# Patient Record
Sex: Male | Born: 1960 | Race: White | Hispanic: No | Marital: Married | Smoking: Never smoker
Health system: Southern US, Community
[De-identification: ages and names within clinical notes are randomized; demographics above are authoritative.]

## PROBLEM LIST (undated history)

## (undated) DIAGNOSIS — Z972 Presence of dental prosthetic device (complete) (partial): Secondary | ICD-10-CM

## (undated) DIAGNOSIS — Z87898 Personal history of other specified conditions: Secondary | ICD-10-CM

## (undated) DIAGNOSIS — Z86718 Personal history of other venous thrombosis and embolism: Secondary | ICD-10-CM

## (undated) DIAGNOSIS — Z973 Presence of spectacles and contact lenses: Secondary | ICD-10-CM

## (undated) DIAGNOSIS — C61 Malignant neoplasm of prostate: Secondary | ICD-10-CM

## (undated) DIAGNOSIS — Z86018 Personal history of other benign neoplasm: Secondary | ICD-10-CM

## (undated) DIAGNOSIS — I10 Essential (primary) hypertension: Secondary | ICD-10-CM

## (undated) DIAGNOSIS — Z8719 Personal history of other diseases of the digestive system: Secondary | ICD-10-CM

## (undated) HISTORY — PX: PARTIAL COLECTOMY: SHX5273

## (undated) HISTORY — PX: PROSTATE BIOPSY: SHX241

---

## 1993-04-20 HISTORY — PX: KNEE ARTHROSCOPY: SUR90

## 2010-08-26 ENCOUNTER — Encounter: Payer: Self-pay | Admitting: Family Medicine

## 2011-02-19 DIAGNOSIS — Z87898 Personal history of other specified conditions: Secondary | ICD-10-CM

## 2011-02-19 HISTORY — DX: Personal history of other specified conditions: Z87.898

## 2012-04-20 DIAGNOSIS — Z8719 Personal history of other diseases of the digestive system: Secondary | ICD-10-CM

## 2012-04-20 DIAGNOSIS — Z86018 Personal history of other benign neoplasm: Secondary | ICD-10-CM

## 2012-04-20 HISTORY — PX: ABDOMINAL SURGERY: SHX537

## 2012-04-20 HISTORY — DX: Personal history of other diseases of the digestive system: Z87.19

## 2012-04-20 HISTORY — DX: Personal history of other benign neoplasm: Z86.018

## 2012-09-27 ENCOUNTER — Encounter: Payer: Self-pay | Admitting: Family Medicine

## 2013-07-19 HISTORY — PX: ESOPHAGOGASTRODUODENOSCOPY: SHX1529

## 2015-08-19 DIAGNOSIS — Z86718 Personal history of other venous thrombosis and embolism: Secondary | ICD-10-CM

## 2015-08-19 HISTORY — DX: Personal history of other venous thrombosis and embolism: Z86.718

## 2017-02-28 ENCOUNTER — Emergency Department (HOSPITAL_COMMUNITY): Payer: BLUE CROSS/BLUE SHIELD

## 2017-02-28 ENCOUNTER — Observation Stay (HOSPITAL_COMMUNITY)
Admission: EM | Admit: 2017-02-28 | Discharge: 2017-03-02 | DRG: 690 | Disposition: A | Discharge status: Home / Self Care | Payer: BLUE CROSS/BLUE SHIELD | Attending: Internal Medicine | Admitting: Internal Medicine

## 2017-02-28 ENCOUNTER — Encounter (HOSPITAL_COMMUNITY): Payer: Self-pay | Admitting: Family Medicine

## 2017-02-28 ENCOUNTER — Other Ambulatory Visit: Payer: Self-pay

## 2017-02-28 ENCOUNTER — Encounter (HOSPITAL_COMMUNITY): Payer: Self-pay | Admitting: *Deleted

## 2017-02-28 ENCOUNTER — Ambulatory Visit (INDEPENDENT_AMBULATORY_CARE_PROVIDER_SITE_OTHER)
Admission: EM | Admit: 2017-02-28 | Discharge: 2017-02-28 | Disposition: A | Discharge status: Home / Self Care | Payer: BLUE CROSS/BLUE SHIELD | Source: Home / Self Care

## 2017-02-28 DIAGNOSIS — N3000 Acute cystitis without hematuria: Secondary | ICD-10-CM | POA: Diagnosis not present

## 2017-02-28 DIAGNOSIS — Z87442 Personal history of urinary calculi: Secondary | ICD-10-CM | POA: Insufficient documentation

## 2017-02-28 DIAGNOSIS — R6510 Systemic inflammatory response syndrome (SIRS) of non-infectious origin without acute organ dysfunction: Secondary | ICD-10-CM | POA: Diagnosis not present

## 2017-02-28 DIAGNOSIS — R339 Retention of urine, unspecified: Secondary | ICD-10-CM | POA: Diagnosis not present

## 2017-02-28 DIAGNOSIS — N410 Acute prostatitis: Secondary | ICD-10-CM | POA: Diagnosis present

## 2017-02-28 DIAGNOSIS — R509 Fever, unspecified: Secondary | ICD-10-CM | POA: Diagnosis not present

## 2017-02-28 DIAGNOSIS — I10 Essential (primary) hypertension: Secondary | ICD-10-CM

## 2017-02-28 DIAGNOSIS — Z7982 Long term (current) use of aspirin: Secondary | ICD-10-CM

## 2017-02-28 DIAGNOSIS — Z9081 Acquired absence of spleen: Secondary | ICD-10-CM | POA: Insufficient documentation

## 2017-02-28 DIAGNOSIS — N401 Enlarged prostate with lower urinary tract symptoms: Secondary | ICD-10-CM | POA: Diagnosis present

## 2017-02-28 HISTORY — DX: Essential (primary) hypertension: I10

## 2017-02-28 LAB — COMPREHENSIVE METABOLIC PANEL
ALBUMIN: 3.9 g/dL (ref 3.5–5.0)
ALK PHOS: 68 U/L (ref 38–126)
ALT: 49 U/L (ref 17–63)
AST: 36 U/L (ref 15–41)
Anion gap: 11 (ref 5–15)
BILIRUBIN TOTAL: 1.2 mg/dL (ref 0.3–1.2)
BUN: 12 mg/dL (ref 6–20)
CALCIUM: 9.3 mg/dL (ref 8.9–10.3)
CO2: 24 mmol/L (ref 22–32)
Chloride: 100 mmol/L — ABNORMAL LOW (ref 101–111)
Creatinine, Ser: 1.03 mg/dL (ref 0.61–1.24)
GFR calc Af Amer: >60 mL/min (ref >60)
GLUCOSE: 117 mg/dL — AB (ref 65–99)
Potassium: 3.8 mmol/L (ref 3.5–5.1)
SODIUM: 135 mmol/L (ref 135–145)
TOTAL PROTEIN: 7.3 g/dL (ref 6.5–8.1)

## 2017-02-28 LAB — POCT URINALYSIS DIP (DEVICE)
BILIRUBIN URINE: NEGATIVE
GLUCOSE, UA: NEGATIVE mg/dL
KETONES UR: 15 mg/dL — AB
NITRITE: NEGATIVE
PH: 5.5 (ref 5.0–8.0)
Protein, ur: 30 mg/dL — AB
Specific Gravity, Urine: 1.015 (ref 1.005–1.030)
Urobilinogen, UA: 0.2 mg/dL (ref 0.0–1.0)

## 2017-02-28 LAB — CBC WITH DIFFERENTIAL/PLATELET
BASOS ABS: 0 10*3/uL (ref 0.0–0.1)
BASOS PCT: 0 %
EOS ABS: 0.1 10*3/uL (ref 0.0–0.7)
EOS PCT: 0 %
HCT: 40.6 % (ref 39.0–52.0)
Hemoglobin: 14.1 g/dL (ref 13.0–17.0)
LYMPHS ABS: 1 10*3/uL (ref 0.7–4.0)
LYMPHS PCT: 5 %
MCH: 32 pg (ref 26.0–34.0)
MCHC: 34.7 g/dL (ref 30.0–36.0)
MCV: 92.3 fL (ref 78.0–100.0)
Monocytes Absolute: 1.4 10*3/uL — ABNORMAL HIGH (ref 0.1–1.0)
Monocytes Relative: 8 %
NEUTROS PCT: 87 %
Neutro Abs: 16.4 10*3/uL — ABNORMAL HIGH (ref 1.7–7.7)
PLATELETS: 281 10*3/uL (ref 150–400)
RBC: 4.4 MIL/uL (ref 4.22–5.81)
RDW: 13.7 % (ref 11.5–15.5)
WBC: 18.9 10*3/uL — ABNORMAL HIGH (ref 4.0–10.5)

## 2017-02-28 LAB — URINALYSIS, ROUTINE W REFLEX MICROSCOPIC
Bilirubin Urine: NEGATIVE
Glucose, UA: NEGATIVE mg/dL
Ketones, ur: 5 mg/dL — AB
Nitrite: NEGATIVE
Protein, ur: NEGATIVE mg/dL
SPECIFIC GRAVITY, URINE: 1.01 (ref 1.005–1.030)
SQUAMOUS EPITHELIAL / LPF: NONE SEEN
pH: 5 (ref 5.0–8.0)

## 2017-02-28 LAB — I-STAT CG4 LACTIC ACID, ED
LACTIC ACID, VENOUS: 0.49 mmol/L — AB (ref 0.5–1.9)
Lactic Acid, Venous: 1.3 mmol/L (ref 0.5–1.9)

## 2017-02-28 MED ORDER — ACETAMINOPHEN 650 MG RE SUPP: 650.0000 mg | Freq: Four times a day (QID) | RECTAL | Status: DC | PRN

## 2017-02-28 MED ORDER — CIPROFLOXACIN HCL 500 MG PO TABS: 500.0000 mg | ORAL_TABLET | Freq: Two times a day (BID) | ORAL | 0 refills | Status: AC

## 2017-02-28 MED ORDER — ONDANSETRON HCL 4 MG PO TABS: 4.0000 mg | ORAL_TABLET | Freq: Four times a day (QID) | ORAL | Status: DC | PRN

## 2017-02-28 MED ORDER — SODIUM CHLORIDE 0.9 % IV BOLUS (SEPSIS)
1000.0000 mL | Freq: Once | INTRAVENOUS | Status: AC
  Administered 2017-02-28: 1000 mL via INTRAVENOUS

## 2017-02-28 MED ORDER — ONDANSETRON HCL 4 MG/2ML IJ SOLN: 4.0000 mg | Freq: Four times a day (QID) | INTRAMUSCULAR | Status: DC | PRN

## 2017-02-28 MED ORDER — ACETAMINOPHEN 325 MG PO TABS
650.0000 mg | ORAL_TABLET | Freq: Four times a day (QID) | ORAL | Status: DC | PRN
  Administered 2017-03-01: 650 mg via ORAL
  Filled 2017-02-28: qty 2

## 2017-02-28 MED ORDER — SODIUM CHLORIDE 0.9 % IV SOLN
INTRAVENOUS | Status: AC
  Administered 2017-03-01 (×3): via INTRAVENOUS

## 2017-02-28 MED ORDER — DEXTROSE 5 % IV SOLN
1.0000 g | Freq: Once | INTRAVENOUS | Status: AC
  Administered 2017-02-28: 1 g via INTRAVENOUS
  Filled 2017-02-28: qty 10

## 2017-02-28 MED ORDER — ACETAMINOPHEN 325 MG PO TABS
650.0000 mg | ORAL_TABLET | Freq: Once | ORAL | Status: AC
  Administered 2017-02-28: 650 mg via ORAL
  Filled 2017-02-28: qty 2

## 2017-02-28 MED ORDER — HYDRALAZINE HCL 20 MG/ML IJ SOLN: 10.0000 mg | INTRAMUSCULAR | Status: DC | PRN

## 2017-02-28 MED ORDER — KETOROLAC TROMETHAMINE 30 MG/ML IJ SOLN
30.0000 mg | Freq: Once | INTRAMUSCULAR | Status: AC
  Administered 2017-02-28: 30 mg via INTRAVENOUS
  Filled 2017-02-28: qty 1

## 2017-02-28 NOTE — ED Triage Notes (Signed)
Pt has had fever and urinary problems since Friday . Pt went to urgent care yesterday and started on Macrobid, returned today due to no improvement and was switched to Cipro, increased fever and unable to void, come to the Ed for re evaluation.

## 2017-02-28 NOTE — ED Triage Notes (Addendum)
Pt here for fever and urinary retention since Friday and worsening. sts that he has been taking an abx, macrobid,  since Friday. sts some abd pressure and dysuria. PSA normal a year ago and hx of kidney stones.

## 2017-02-28 NOTE — ED Notes (Signed)
Dr.Yao at bedside to speak with pt on further care.

## 2017-02-28 NOTE — Discharge Instructions (Signed)
Recommend stop Macrobid and switch to Cipro 500mg  twice a day as directed. Strongly encouraged to go to ER for further evaluation, especially if urinary retention continues. Otherwise, follow-up with a Urologist for further evaluation and treatment.

## 2017-02-28 NOTE — ED Provider Notes (Signed)
[facility] DEPT Provider Note   CSN: [id number] Arrival date & time: 02/28/17  1714     History   Chief Complaint Chief Complaint  Patient presents with  . Urinary Retention    HPI Antonio Murphy is a 56 y.o. male history of hypertension, previous abdominal tumor status post resection, BPH here with urinary retention.  Patient had fever for the last 3 days and went to urgent care yesterday and was prescribed Macrobid for urinary tract infection.  He has progressive trouble urinating today and went back to urgent care and has a UA that again shows a urinary tract infection.  Was also concern for possible bladder stone and urinary retention.  Patient was noted to be febrile 102 in triage.   The history is provided by the patient.    Past Medical History:  Diagnosis Date  . Benign tumor   . Hypertension     There are no active problems to display for this patient.   Past Surgical History:  Procedure Laterality Date  . ABDOMINAL SURGERY    . SPLENECTOMY         Home Medications    Prior to Admission medications   Medication Sig Start Date End Date Taking? Authorizing Provider  ciprofloxacin (CIPRO) 500 MG tablet Take 1 tablet (500 mg total) 2 (two) times daily for 10 days by mouth. 02/28/17 03/10/17  Amyot, Nicholes Stairs, NP    Family History No family history on file.  Social History Social History   Tobacco Use  . Smoking status: Never Smoker  . Smokeless tobacco: Never Used  Substance Use Topics  . Alcohol use: Yes  . Drug use: No     Allergies   Patient has no known allergies.   Review of Systems Review of Systems  Gastrointestinal: Positive for abdominal pain.  Genitourinary: Positive for decreased urine volume and difficulty urinating.  All other systems reviewed and are negative.    Physical Exam Updated Vital Signs BP 116/72 (BP Location: Right Arm)   Pulse 82   Temp 99.8 F (37.7 C) (Oral)   Resp 14   Ht  6\' 1"  (1.854 m)   Wt 84.8 kg (187 lb)   SpO2 98%   BMI 24.67 kg/m   Physical Exam  Constitutional: He appears well-nourished.  HENT:  Head: Normocephalic.  MM dry   Eyes: Conjunctivae and EOM are normal. Pupils are equal, round, and reactive to light.  Neck: Normal range of motion. Neck supple.  Cardiovascular: Normal rate, regular rhythm and normal heart sounds.  Pulmonary/Chest: Effort normal and breath sounds normal. No stridor. No respiratory distress. He has no wheezes.  Abdominal: Soft. Bowel sounds are normal.  Mild suprapubic tenderness. No CVAT   Genitourinary:  Genitourinary Comments: Prostate enlarged, nontender   Musculoskeletal: Normal range of motion.  Neurological: He is alert.  Skin: Skin is warm.  Psychiatric: He has a normal mood and affect.  Nursing note and vitals reviewed.    ED Treatments / Results  Labs (all labs ordered are listed, but only abnormal results are displayed) Labs Reviewed  CBC WITH DIFFERENTIAL/PLATELET - Abnormal; Notable for the following components:      Result Value   WBC 18.9 (*)    Neutro Abs 16.4 (*)    Monocytes Absolute 1.4 (*)    All other components within normal limits  COMPREHENSIVE METABOLIC PANEL - Abnormal; Notable for the following components:   Chloride 100 (*)    Glucose, Bld 117 (*)  All other components within normal limits  URINALYSIS, ROUTINE W REFLEX MICROSCOPIC - Abnormal; Notable for the following components:   Hgb urine dipstick SMALL (*)    Ketones, ur 5 (*)    Leukocytes, UA TRACE (*)    Bacteria, UA FEW (*)    All other components within normal limits  CULTURE, BLOOD (ROUTINE X 2)  CULTURE, BLOOD (ROUTINE X 2)  URINE CULTURE  I-STAT CG4 LACTIC ACID, ED  I-STAT CG4 LACTIC ACID, ED    EKG  EKG Interpretation None       Radiology Dg Abdomen 1 View  Result Date: 02/28/2017 CLINICAL DATA:  56 year old male with difficulty urinating. EXAM: ABDOMEN - 1 VIEW COMPARISON:  Ultrasound dated  02/28/2017 FINDINGS: There is moderate stool throughout the colon. No bowel dilatation or evidence of obstruction. Multiple radiopaque foci over the pelvis noted which may represent pelvic calcifications and phleboliths. Distal ureteral calculi is not entirely excluded. A 7 mm ovoid radiopaque density in the right hemipelvis likely represent fecal material or a diverticulith. No radiopaque calculi noted over the flank. The osseous structures and soft tissues are grossly unremarkable. IMPRESSION: 1. No definite radiopaque calculi over the flank. Scattered radiopaque foci over the pelvis may represent pelvic phleboliths. 2. No bowel dilatation or evidence of obstruction. Electronically Signed   By: Anner Crete M.D.   On: 02/28/2017 19:34   US Renal  Result Date: 02/28/2017 CLINICAL DATA:  56 year old male with urinary retention and urgency. EXAM: RENAL / URINARY TRACT ULTRASOUND COMPLETE COMPARISON:  None. FINDINGS: Right Kidney: Length: 11.8 cm. Normal echogenicity. No hydronephrosis or echogenic stone. Left Kidney: Length: 13.4 cm. Normal echogenicity. No hydronephrosis or echogenic stone. Bladder: The urinary bladder is decompressed around a Foley catheter and poorly visualized. IMPRESSION: Unremarkable renal ultrasound. Electronically Signed   By: Anner Crete M.D.   On: 02/28/2017 19:28    Procedures Procedures (including critical care time)  Medications Ordered in ED Medications  cefTRIAXone (ROCEPHIN) 1 g in dextrose 5 % 50 mL IVPB (1 g Intravenous New Bag/Given 02/28/17 1954)  acetaminophen (TYLENOL) tablet 650 mg (650 mg Oral Given 02/28/17 1821)  sodium chloride 0.9 % bolus 1,000 mL (0 mLs Intravenous Stopped 02/28/17 1941)  ketorolac (TORADOL) 30 MG/ML injection 30 mg (30 mg Intravenous Given 02/28/17 1953)     Initial Impression / Assessment and Plan / ED Course  I have reviewed the triage vital signs and the nursing notes.  Pertinent labs & imaging results that were  available during my care of the patient were reviewed by me and considered in my medical decision making (see chart for details).    Antonio Murphy is a 56 y.o. male here with fever, suprapubic pain, urinary retention. His bladder scan showed 250 cc in triage. Will do sepsis workup and get CBC, CMP, blood and urine cultures, UA. I want to get CT ab/pel to r/o pyelo vs stone but he had multiple CTs in [place] and requests [place].   8:10 PM WBC 18. UA showed UTI. Still febrile 102 despite tylenol. Given toradol. US showed no stone. Given rocephin. Hospitalist to admit.    Final Clinical Impressions(s) / ED Diagnoses   Final diagnoses:  None    ED Discharge Orders    None       Drenda Freeze, MD 02/28/17 2011

## 2017-02-28 NOTE — ED Provider Notes (Signed)
[facility]    CSN: [id number] Arrival date & time: 02/28/17  1533     History   Chief Complaint Chief Complaint  Patient presents with  . Urinary Retention  . Fever    HPI Meng Winterton is a 56 y.o. male.   56 year old male presents with low grade fever that started about 3 days ago. Also felt some lower abdominal pressure and discomfort. Yesterday started having more difficulty urinating so went to another Urgent Care center. They placed him on Macrobid. He continues to have a fever around 100 and urinary retention has worsened. He denies any back pain or unusual penile discharge. He does have a history of kidney stones about 3 years ago. He had an appointment with a Urologist 3 years ago but canceled it when he had passed the stone. He did have a normal PSA with his PCP about 1 year ago. He has noticed slight increase in urinary frequency but otherwise no other symptoms until 3 days ago. He does not take any daily medication.    The history is provided by the patient.    Past Medical History:  Diagnosis Date  . Benign tumor   . Hypertension     There are no active problems to display for this patient.   Past Surgical History:  Procedure Laterality Date  . ABDOMINAL SURGERY    . SPLENECTOMY         Home Medications    Prior to Admission medications   Medication Sig Start Date End Date Taking? Authorizing Provider  ciprofloxacin (CIPRO) 500 MG tablet Take 1 tablet (500 mg total) 2 (two) times daily for 10 days by mouth. 02/28/17 03/10/17  Odette Watanabe, Nicholes Stairs, NP    Family History History reviewed. No pertinent family history.  Social History Social History   Tobacco Use  . Smoking status: Never Smoker  . Smokeless tobacco: Never Used  Substance Use Topics  . Alcohol use: Yes  . Drug use: No     Allergies   Patient has no known allergies.   Review of Systems Review of Systems  Constitutional: Positive for fatigue and fever. Negative for  appetite change and chills.  HENT: Negative for mouth sores and sore throat.   Respiratory: Negative for cough, chest tightness and shortness of breath.   Cardiovascular: Negative for chest pain.  Gastrointestinal: Positive for abdominal pain (lower abdominal pressure). Negative for constipation, diarrhea, nausea and vomiting.  Genitourinary: Positive for decreased urine volume, difficulty urinating and frequency. Negative for discharge, enuresis, flank pain, genital sores, hematuria, penile pain, penile swelling, scrotal swelling, testicular pain and urgency.  Musculoskeletal: Negative for arthralgias, back pain and myalgias.  Skin: Negative for rash and wound.  Allergic/Immunologic: Negative for immunocompromised state.  Neurological: Negative for dizziness, syncope, weakness, light-headedness, numbness and headaches.  Hematological: Negative for adenopathy. Does not bruise/bleed easily.     Physical Exam Triage Vital Signs ED Triage Vitals  Enc Vitals Group     BP 02/28/17 1556 113/85     Pulse Rate 02/28/17 1556 93     Resp 02/28/17 1556 18     Temp 02/28/17 1556 100 F (37.8 C)     Temp Source 02/28/17 1556 Oral     SpO2 02/28/17 1556 96 %     Weight --      Height --      Head Circumference --      Peak Flow --      Pain Score 02/28/17 1551  3     Pain Loc --      Pain Edu? --      Excl. in [place]? --    No data found.  Updated Vital Signs BP 113/85   Pulse 93   Temp 100 F (37.8 C) (Oral)   Resp 18   SpO2 96%   Visual Acuity Right Eye Distance:   Left Eye Distance:   Bilateral Distance:    Right Eye Near:   Left Eye Near:    Bilateral Near:     Physical Exam  Constitutional: He is oriented to person, place, and time. He appears well-developed and well-nourished. No distress.  HENT:  Head: Normocephalic and atraumatic.  Eyes: Conjunctivae and EOM are normal.  Neck: Normal range of motion.  Cardiovascular: Normal rate, regular rhythm and normal heart sounds.   No murmur heard. Pulmonary/Chest: Effort normal and breath sounds normal. No respiratory distress.  Abdominal: Soft. Bowel sounds are normal. He exhibits no mass. There is tenderness in the right lower quadrant, suprapubic area and left lower quadrant. There is no rigidity, no rebound, no guarding and no CVA tenderness.  Musculoskeletal: Normal range of motion.  Neurological: He is alert and oriented to person, place, and time.  Skin: Skin is warm and dry. No rash noted.  Psychiatric: He has a normal mood and affect. His behavior is normal. Judgment and thought content normal.     UC Treatments / Results  Labs (all labs ordered are listed, but only abnormal results are displayed) Labs Reviewed  POCT URINALYSIS DIP (DEVICE) - Abnormal; Notable for the following components:      Result Value   Ketones, ur 15 (*)    Hgb urine dipstick SMALL (*)    Protein, ur 30 (*)    Leukocytes, UA LARGE (*)    All other components within normal limits  URINE CULTURE    EKG  EKG Interpretation None       Radiology No results found.  Procedures Procedures (including critical care time)  Medications Ordered in UC Medications - No data to display   Initial Impression / Assessment and Plan / UC Course  I have reviewed the triage vital signs and the nursing notes.  Pertinent labs & imaging results that were available during my care of the patient were reviewed by me and considered in my medical decision making (see chart for details).    Reviewed with patient results of urinalysis. Discussed that he may have blockage from a renal calculi or may have prostatitis which is causing the urinary retention. Unable to determine exact cause without further testing and imaging studies. Would recommend further evaluation at the ER now. Patient uncertain if he will go to the ER and desires to go to [facility] ER since it is closer to his house. Discussed that I will switch his antibiotic from Macrobid  to Cipro 500mg  twice a day as directed. Recommend call a Urologist tomorrow (information provided) if he does not go to the ER tonight for further evaluation. Patient understands and agrees with plan.   Final Clinical Impressions(s) / UC Diagnoses   Final diagnoses:  Urinary retention  Fever, unspecified    ED Discharge Orders        Ordered    ciprofloxacin (CIPRO) 500 MG tablet  2 times daily     02/28/17 1642       Controlled Substance Prescriptions [place] Controlled Substance Registry consulted? Not Applicable   Katy Apo, NP 02/28/17 1909

## 2017-02-28 NOTE — ED Notes (Signed)
Bed: WA17 Expected date:  Expected time:  Means of arrival:  Comments: Triage 1  

## 2017-02-28 NOTE — H&P (Signed)
History and Physical    Antonio Murphy YQI:[id number] DOB: February 24, 1961 DOA: 02/28/2017  PCP: System, Pcp Not In  Patient coming from: Home.  Chief Complaint: Difficulty urinating and fever and chills.  HPI: Antonio Murphy is a 56 y.o. 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  Yesterday patient started developing difficulty urinating with fever and chills continuing.  Denies any chest pain or shortness of breath or productive cough.  Denies any abdominal pain or diarrhea.  ED Course: In the ER patient is found to be tachycardic with temperatures of around 102 F WBC count was 18 and UA is compatible with UTI.  Patient had a urinary retention about 700 mL in the bladder and had to be placed on Foley catheter.  Patient was started on ceftriaxone cultures were sent and patient admitted for further observation.  Sonogram of the abdomen did not show any obstruction.  Patient is hesitant to get any CAT scans for now.  Review of Systems: As per HPI, rest all negative.   Past Medical History:  Diagnosis Date  . Benign tumor   . Hypertension     Past Surgical History:  Procedure Laterality Date  . ABDOMINAL SURGERY    . SPLENECTOMY       reports that  has never smoked. he has never used smokeless tobacco. He reports that he drinks alcohol. He reports that he does not use drugs.  No Known Allergies  Family History - Mother Lung Cancer, Father Brain Aneurysm.  Prior to Admission medications   Medication Sig Start Date End Date Taking? Authorizing Provider  amLODipine-benazepril (LOTREL) 5-20 MG capsule Take 1 capsule daily by mouth.   Yes [provider]  aspirin EC 81 MG tablet Take 81  mg daily by mouth.   Yes [provider]  ibuprofen (ADVIL,MOTRIN) 200 MG tablet Take 400 mg every 6 (six) hours as needed by mouth for moderate pain.   Yes [provider]  nitrofurantoin, macrocrystal-monohydrate, (MACROBID) 100 MG capsule Take 100 mg 2 (two) times daily by mouth.   Yes [provider]  ciprofloxacin (CIPRO) 500 MG tablet Take 1 tablet (500 mg total) 2 (two) times daily for 10 days by mouth. 02/28/17 03/10/17  Katy Apo, NP    Physical Exam: Vitals:   02/28/17 1847 02/28/17 1942 02/28/17 2131 02/28/17 2234  BP:  116/72 104/82 111/82  Pulse:  82 68 65  Resp:  14 14 16   Temp: (!) 102.1 F (38.9 C) 99.8 F (37.7 C)  98.4 F (36.9 C)  TempSrc: Oral Oral  Oral  SpO2:  98% 98% 99%  Weight:      Height:          Constitutional: Moderately built and nourished. Vitals:   02/28/17 1847 02/28/17 1942 02/28/17 2131 02/28/17 2234  BP:  116/72 104/82 111/82  Pulse:  82 68 65  Resp:  14 14 16   Temp: (!) 102.1 F (38.9 C) 99.8 F (37.7 C)  98.4 F (36.9 C)  TempSrc: Oral Oral  Oral  SpO2:  98% 98% 99%  Weight:      Height:       Eyes: Anicteric no pallor. ENMT: No discharge from the  ears eyes nose or mouth. Neck: No mass felt.  No neck rigidity. Respiratory: No rhonchi or crepitations. Cardiovascular: S1-S2 heard no murmurs appreciated. Abdomen: Soft nontender bowel sounds present. Musculoskeletal: No edema.  No joint effusion. Skin: No rash.  Skin appears warm. Neurologic: Alert awake oriented to time place and person.  Moves all extremities. Psychiatric: Appears normal.  Normal affect.   Labs on Admission: I have personally reviewed following labs and imaging studies  CBC: Recent Labs  Lab 02/28/17 1800  WBC 18.9*  NEUTROABS 16.4*  HGB 14.1  HCT 40.6  MCV 92.3  PLT 923   Basic Metabolic Panel: Recent Labs  Lab 02/28/17 1800  NA 135  K 3.8  CL 100*  CO2 24  GLUCOSE 117*  BUN 12  CREATININE 1.03  CALCIUM  9.3   GFR: Estimated Creatinine Clearance: 89.4 mL/min (by C-G formula based on SCr of 1.03 mg/dL). Liver Function Tests: Recent Labs  Lab 02/28/17 1800  AST 36  ALT 49  ALKPHOS 68  BILITOT 1.2  PROT 7.3  ALBUMIN 3.9   No results for input(s): LIPASE, AMYLASE in the last 168 hours. No results for input(s): AMMONIA in the last 168 hours. Coagulation Profile: No results for input(s): INR, PROTIME in the last 168 hours. Cardiac Enzymes: No results for input(s): CKTOTAL, CKMB, CKMBINDEX, TROPONINI in the last 168 hours. BNP (last 3 results) No results for input(s): PROBNP in the last 8760 hours. HbA1C: No results for input(s): HGBA1C in the last 72 hours. CBG: No results for input(s): GLUCAP in the last 168 hours. Lipid Profile: No results for input(s): CHOL, HDL, LDLCALC, TRIG, CHOLHDL, LDLDIRECT in the last 72 hours. Thyroid Function Tests: No results for input(s): TSH, T4TOTAL, FREET4, T3FREE, THYROIDAB in the last 72 hours. Anemia Panel: No results for input(s): VITAMINB12, FOLATE, FERRITIN, TIBC, IRON, RETICCTPCT in the last 72 hours. Urine analysis:    Component Value Date/Time   COLORURINE YELLOW 02/28/2017 1801   APPEARANCEUR CLEAR 02/28/2017 1801   LABSPEC 1.010 02/28/2017 1801   PHURINE 5.0 02/28/2017 1801   GLUCOSEU NEGATIVE 02/28/2017 1801   HGBUR SMALL (A) 02/28/2017 1801   BILIRUBINUR NEGATIVE 02/28/2017 1801   KETONESUR 5 (A) 02/28/2017 1801   PROTEINUR NEGATIVE 02/28/2017 1801   UROBILINOGEN 0.2 02/28/2017 1615   NITRITE NEGATIVE 02/28/2017 1801   LEUKOCYTESUR TRACE (A) 02/28/2017 1801   Sepsis Labs: @LABRCNTIP (procalcitonin:4,lacticidven:4) )No results found for this or any previous visit (from the past 240 hour(s)).   Radiological Exams on Admission: Dg Abdomen 1 View  Result Date: 02/28/2017 CLINICAL DATA:  56 year old male with difficulty urinating. EXAM: ABDOMEN - 1 VIEW COMPARISON:  Ultrasound dated 02/28/2017 FINDINGS: There is moderate  stool throughout the colon. No bowel dilatation or evidence of obstruction. Multiple radiopaque foci over the pelvis noted which may represent pelvic calcifications and phleboliths. Distal ureteral calculi is not entirely excluded. A 7 mm ovoid radiopaque density in the right hemipelvis likely represent fecal material or a diverticulith. No radiopaque calculi noted over the flank. The osseous structures and soft tissues are grossly unremarkable. IMPRESSION: 1. No definite radiopaque calculi over the flank. Scattered radiopaque foci over the pelvis may represent pelvic phleboliths. 2. No bowel dilatation or evidence of obstruction. Electronically Signed   By: Anner Crete M.D.   On: 02/28/2017 19:34   US Renal  Result Date: 02/28/2017 CLINICAL DATA:  56 year old male with urinary retention and urgency. EXAM: RENAL / URINARY TRACT ULTRASOUND COMPLETE COMPARISON:  None. FINDINGS: Right Kidney: Length: 11.8 cm.  Normal echogenicity. No hydronephrosis or echogenic stone. Left Kidney: Length: 13.4 cm. Normal echogenicity. No hydronephrosis or echogenic stone. Bladder: The urinary bladder is decompressed around a Foley catheter and poorly visualized. IMPRESSION: Unremarkable renal ultrasound. Electronically Signed   By: Anner Crete M.D.   On: 02/28/2017 19:28     Assessment/Plan Active Problems:   SIRS (systemic inflammatory response syndrome) (HCC)   Urinary retention   Acute cystitis without hematuria   Hypertension    1. SIRS secondary to UTI -patient has been placed on ceftriaxone.  Follow cultures.  Continue with hydration. 2. Urinary retention -Foley catheter has been placed.  I have discussed with Dr. Risa Grill on-call urologist who advised to start patient on Flomax and urologist will be seeing patient in consult. 3. Leukocytosis likely from infection and possible splenectomy.  Follow cultures. 4. History of hypertension -patient takes amlodipine and benazepril.  Will continue amlodipine  but hold benazepril until we make sure her creatinine is not worsening.  PRN IV hydralazine. 5. 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   DVT prophylaxis: SCDs. Code Status: Full code. Family Communication: Discussed with patient. Disposition Plan: Home. Consults called: Urologist. Admission status: Observation.   Rise Patience MD [facility] Pager [phone].  If 7PM-7AM, please contact night-coverage [email or web address] Password [facility]  02/28/2017, 11:57 PM

## 2017-02-28 NOTE — ED Notes (Signed)
ED TO INPATIENT HANDOFF REPORT  Name/Age/Gender Antonio Murphy 56 y.o. male  Code Status Code Status History    This patient does not have a recorded code status. Please follow your organizational policy for patients in this situation.      Home/SNF/Other Home    Chief Complaint Acute Urinary Retention   Level of Care/Admitting Diagnosis ED Disposition    ED Disposition Condition Comment   Admit  Hospital Area: [facility] [[id number]]  Level of Care: Med-Surg [16]  Diagnosis: SIRS (systemic inflammatory response syndrome) [facility]) [025852]  Admitting Physician: Rise Patience [phone]  Attending Physician: Rise Patience [email or web address]  PT Class (Do Not Modify): Observation [104]  PT Acc Code (Do Not Modify): Observation [10022]       Medical History Past Medical History:  Diagnosis Date  . Benign tumor   . Hypertension     Allergies No Known Allergies  IV Location/Drains/Wounds Patient Lines/Drains/Airways Status   Active Line/Drains/Airways    Name:   Placement date:   Placement time:   Site:   Days:   Peripheral IV 02/28/17 Left Forearm   02/28/17    1822    Forearm   less than 1   Urethral Catheter Elder Cyphers, NT Latex 14 Fr.   02/28/17    1835    Latex   less than 1          Labs/Imaging Results for orders placed or performed during the hospital encounter of 02/28/17 (from the past 48 hour(s))  CBC with Differential/Platelet     Status: Abnormal   Collection Time: 02/28/17  6:00 PM  Result Value Ref Range   WBC 18.9 (H) 4.0 - 10.5 K/uL   RBC 4.40 4.22 - 5.81 MIL/uL   Hemoglobin 14.1 13.0 - 17.0 g/dL   HCT 40.6 39.0 - 52.0 %   MCV 92.3 78.0 - 100.0 fL   MCH 32.0 26.0 - 34.0 pg   MCHC 34.7 30.0 - 36.0 g/dL   RDW 13.7 11.5 - 15.5 %   Platelets 281 150 - 400 K/uL   Neutrophils Relative % 87 %   Neutro Abs 16.4 (H) 1.7 - 7.7 K/uL   Lymphocytes Relative 5 %   Lymphs Abs 1.0 0.7 - 4.0 K/uL   Monocytes Relative 8 %   Monocytes  Absolute 1.4 (H) 0.1 - 1.0 K/uL   Eosinophils Relative 0 %   Eosinophils Absolute 0.1 0.0 - 0.7 K/uL   Basophils Relative 0 %   Basophils Absolute 0.0 0.0 - 0.1 K/uL  Comprehensive metabolic panel     Status: Abnormal   Collection Time: 02/28/17  6:00 PM  Result Value Ref Range   Sodium 135 135 - 145 mmol/L   Potassium 3.8 3.5 - 5.1 mmol/L   Chloride 100 (L) 101 - 111 mmol/L   CO2 24 22 - 32 mmol/L   Glucose, Bld 117 (H) 65 - 99 mg/dL   BUN 12 6 - 20 mg/dL   Creatinine, Ser 1.03 0.61 - 1.24 mg/dL   Calcium 9.3 8.9 - 10.3 mg/dL   Total Protein 7.3 6.5 - 8.1 g/dL   Albumin 3.9 3.5 - 5.0 g/dL   AST 36 15 - 41 U/L   ALT 49 17 - 63 U/L   Alkaline Phosphatase 68 38 - 126 U/L   Total Bilirubin 1.2 0.3 - 1.2 mg/dL   GFR calc non Af Amer >60 >60 mL/min   GFR calc Af Amer >60 >60 mL/min  Comment: (NOTE) The eGFR has been calculated using the CKD EPI equation. This calculation has not been validated in all clinical situations. eGFR's persistently <60 mL/min signify possible Chronic Kidney Disease.    Anion gap 11 5 - 15  Urinalysis, Routine w reflex microscopic     Status: Abnormal   Collection Time: 02/28/17  6:01 PM  Result Value Ref Range   Color, Urine YELLOW YELLOW   APPearance CLEAR CLEAR   Specific Gravity, Urine 1.010 1.005 - 1.030   pH 5.0 5.0 - 8.0   Glucose, UA NEGATIVE NEGATIVE mg/dL   Hgb urine dipstick SMALL (A) NEGATIVE   Bilirubin Urine NEGATIVE NEGATIVE   Ketones, ur 5 (A) NEGATIVE mg/dL   Protein, ur NEGATIVE NEGATIVE mg/dL   Nitrite NEGATIVE NEGATIVE   Leukocytes, UA TRACE (A) NEGATIVE   RBC / HPF 0-5 0 - 5 RBC/hpf   WBC, UA 6-30 0 - 5 WBC/hpf   Bacteria, UA FEW (A) NONE SEEN   Squamous Epithelial / LPF NONE SEEN NONE SEEN   Mucus PRESENT   I-Stat CG4 Lactic Acid, ED     Status: None   Collection Time: 02/28/17  6:20 PM  Result Value Ref Range   Lactic Acid, Venous 1.30 0.5 - 1.9 mmol/L  I-Stat CG4 Lactic Acid, ED     Status: Abnormal   Collection  Time: 02/28/17  9:12 PM  Result Value Ref Range   Lactic Acid, Venous 0.49 (L) 0.5 - 1.9 mmol/L   Dg Abdomen 1 View  Result Date: 02/28/2017 CLINICAL DATA:  57 year old male with difficulty urinating. EXAM: ABDOMEN - 1 VIEW COMPARISON:  Ultrasound dated 02/28/2017 FINDINGS: There is moderate stool throughout the colon. No bowel dilatation or evidence of obstruction. Multiple radiopaque foci over the pelvis noted which may represent pelvic calcifications and phleboliths. Distal ureteral calculi is not entirely excluded. A 7 mm ovoid radiopaque density in the right hemipelvis likely represent fecal material or a diverticulith. No radiopaque calculi noted over the flank. The osseous structures and soft tissues are grossly unremarkable. IMPRESSION: 1. No definite radiopaque calculi over the flank. Scattered radiopaque foci over the pelvis may represent pelvic phleboliths. 2. No bowel dilatation or evidence of obstruction. Electronically Signed   By: Anner Crete M.D.   On: 02/28/2017 19:34   US Renal  Result Date: 02/28/2017 CLINICAL DATA:  56 year old male with urinary retention and urgency. EXAM: RENAL / URINARY TRACT ULTRASOUND COMPLETE COMPARISON:  None. FINDINGS: Right Kidney: Length: 11.8 cm. Normal echogenicity. No hydronephrosis or echogenic stone. Left Kidney: Length: 13.4 cm. Normal echogenicity. No hydronephrosis or echogenic stone. Bladder: The urinary bladder is decompressed around a Foley catheter and poorly visualized. IMPRESSION: Unremarkable renal ultrasound. Electronically Signed   By: Anner Crete M.D.   On: 02/28/2017 19:28    Pending Labs Unresulted Labs (From admission, onward)   Start     Ordered   02/28/17 1801  Blood culture (routine x 2)  BLOOD CULTURE X 2,   STAT     02/28/17 1800   02/28/17 1801  Urine culture  STAT,   STAT     02/28/17 1800      Vitals/Pain Today's Vitals   02/28/17 1847 02/28/17 1942 02/28/17 1942 02/28/17 2131  BP:   116/72 104/82   Pulse:   82 68  Resp:   14 14  Temp: (!) 102.1 F (38.9 C)  99.8 F (37.7 C)   TempSrc: Oral  Oral   SpO2:   98% 98%  Weight:  Height:      PainSc:  0-No pain      Isolation Precautions No active isolations  Medications Medications  acetaminophen (TYLENOL) tablet 650 mg (650 mg Oral Given 02/28/17 1821)  sodium chloride 0.9 % bolus 1,000 mL (0 mLs Intravenous Stopped 02/28/17 1941)  cefTRIAXone (ROCEPHIN) 1 g in dextrose 5 % 50 mL IVPB (0 g Intravenous Stopped 02/28/17 2026)  ketorolac (TORADOL) 30 MG/ML injection 30 mg (30 mg Intravenous Given 02/28/17 1953)    Mobility WALKS

## 2017-02-28 NOTE — ED Notes (Signed)
Patient unable to void at this time

## 2017-03-01 ENCOUNTER — Encounter (HOSPITAL_COMMUNITY): Payer: Self-pay | Admitting: Internal Medicine

## 2017-03-01 DIAGNOSIS — I10 Essential (primary) hypertension: Secondary | ICD-10-CM | POA: Diagnosis not present

## 2017-03-01 DIAGNOSIS — N3000 Acute cystitis without hematuria: Secondary | ICD-10-CM | POA: Diagnosis not present

## 2017-03-01 DIAGNOSIS — R6510 Systemic inflammatory response syndrome (SIRS) of non-infectious origin without acute organ dysfunction: Secondary | ICD-10-CM

## 2017-03-01 DIAGNOSIS — R339 Retention of urine, unspecified: Secondary | ICD-10-CM

## 2017-03-01 DIAGNOSIS — D72829 Elevated white blood cell count, unspecified: Secondary | ICD-10-CM | POA: Diagnosis not present

## 2017-03-01 LAB — BASIC METABOLIC PANEL
ANION GAP: 7 (ref 5–15)
BUN: 12 mg/dL (ref 6–20)
CO2: 22 mmol/L (ref 22–32)
Calcium: 8.6 mg/dL — ABNORMAL LOW (ref 8.9–10.3)
Chloride: 107 mmol/L (ref 101–111)
Creatinine, Ser: 0.98 mg/dL (ref 0.61–1.24)
GLUCOSE: 99 mg/dL (ref 65–99)
POTASSIUM: 3.9 mmol/L (ref 3.5–5.1)
Sodium: 136 mmol/L (ref 135–145)

## 2017-03-01 LAB — CBC
HEMATOCRIT: 36.6 % — AB (ref 39.0–52.0)
HEMOGLOBIN: 12.5 g/dL — AB (ref 13.0–17.0)
MCH: 31.6 pg (ref 26.0–34.0)
MCHC: 34.2 g/dL (ref 30.0–36.0)
MCV: 92.7 fL (ref 78.0–100.0)
Platelets: 277 10*3/uL (ref 150–400)
RBC: 3.95 MIL/uL — AB (ref 4.22–5.81)
RDW: 13.8 % (ref 11.5–15.5)
WBC: 16 10*3/uL — ABNORMAL HIGH (ref 4.0–10.5)

## 2017-03-01 LAB — HIV ANTIBODY (ROUTINE TESTING W REFLEX): HIV Screen 4th Generation wRfx: NONREACTIVE

## 2017-03-01 MED ORDER — OCUVITE-LUTEIN PO CAPS
1.0000 | ORAL_CAPSULE | Freq: Every day | ORAL | Status: DC
  Filled 2017-03-01: qty 1

## 2017-03-01 MED ORDER — DEXTROSE 5 % IV SOLN
2.0000 g | INTRAVENOUS | Status: DC
  Administered 2017-03-01 – 2017-03-02 (×2): 2 g via INTRAVENOUS
  Filled 2017-03-01 (×2): qty 2

## 2017-03-01 MED ORDER — OCUVITE-LUTEIN PO CAPS
1.0000 | ORAL_CAPSULE | Freq: Every day | ORAL | Status: DC
Start: 2017-03-01 — End: 2017-03-01

## 2017-03-01 MED ORDER — TAMSULOSIN HCL 0.4 MG PO CAPS
0.4000 mg | ORAL_CAPSULE | Freq: Every day | ORAL | Status: DC
  Administered 2017-03-01 – 2017-03-02 (×2): 0.4 mg via ORAL
  Filled 2017-03-01 (×2): qty 1

## 2017-03-01 MED ORDER — AMLODIPINE BESYLATE 5 MG PO TABS
5.0000 mg | ORAL_TABLET | Freq: Every day | ORAL | Status: DC
  Administered 2017-03-01 – 2017-03-02 (×2): 5 mg via ORAL
  Filled 2017-03-01 (×2): qty 1

## 2017-03-01 NOTE — Progress Notes (Signed)
Patient's temperature = 102.34F; notified MD; gave tylenol 650mg  as ordered

## 2017-03-01 NOTE — Progress Notes (Signed)
PHARMACY NOTE -  ANTIBIOTIC RENAL DOSE ADJUSTMENT   Request received for Pharmacy to assist with antibiotic renal dose adjustment.  Patient has been initiated on Ceftriaxone 2gm iv q24hr for UTI. SCr 1.03, estimated CrCl 89 ml/min Current dosage is appropriate and need for further dosage adjustment appears unlikely at present. Will sign off at this time.  Please reconsult if a change in clinical status warrants re-evaluation of dosage.

## 2017-03-01 NOTE — Progress Notes (Signed)
Patient ID: Antonio Murphy, male   DOB: 1961-03-07, 56 y.o.   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  He was found to be in urinary retention for which Foley catheter was placed and antibiotics started.  Urology was consulted.  Assessment & Plan:   Active Problems:   SIRS (systemic inflammatory response syndrome) (HCC)   Urinary retention   Acute cystitis without hematuria   Hypertension     1. SIRS secondary to UTI -continue Rocephin.  Follow cultures. Hemodynamically stable for now  2. Urinary retention -continue Foley catheter.  Urology consult appreciated.  Outpatient follow-up with urology  3. Leukocytosis likely from infection and possible splenectomy-improving.  Repeat a.m. labs.  Follow cultures.  Continue current antibiotics 4. History of hypertension -patient takes amlodipine and benazepril.  Will continue amlodipine but hold benazepril until we make sure her creatinine is not worsening.  5. 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   DVT prophylaxis: SCDs Code Status: Full Family Communication: None Disposition Plan: Home in 1-2 days  Consultants: Urology  Procedures: None  Antimicrobials: Rocephin from 02/28/2017   Subjective: Patient seen and examined.  No current fever or vomiting.  Denies abdominal pain Objective: Vitals:   02/28/17 2131 02/28/17 2234 03/01/17 0431 03/01/17 0805  BP: 104/82 111/82 107/74   Pulse: 68 65 82   Resp: 14 16 16    Temp:  98.4 F (36.9 C) 99.5 F (37.5 C)   TempSrc:  Oral Oral   SpO2: 98% 99% 97% 96%  Weight:      Height:         Intake/Output Summary (Last 24 hours) at 03/01/2017 1026 Last data filed at 03/01/2017 0813 Gross per 24 hour  Intake 2041.67 ml  Output 650 ml  Net 1391.67 ml   Filed Weights   02/28/17 1810  Weight: 84.8 kg (187 lb)    Examination:  General exam: Appears calm and comfortable  Respiratory system: Bilateral decreased breath sound at bases Cardiovascular system: S1 & S2 heard, rate controlled  gastrointestinal system: Abdomen is nondistended, soft and nontender. Normal bowel sounds heard. Extremities: No cyanosis, clubbing, edema    Data Reviewed: I have personally reviewed following labs and imaging studies  CBC: Recent Labs  Lab 02/28/17 1800 03/01/17 0527  WBC 18.9* 16.0*  NEUTROABS 16.4*  --   HGB 14.1 12.5*  HCT 40.6 36.6*  MCV 92.3 92.7  PLT 281 858   Basic Metabolic Panel: Recent Labs  Lab 02/28/17 1800 03/01/17 0527  NA 135 136  K 3.8 3.9  CL 100* 107  CO2 24 22  GLUCOSE 117* 99  BUN 12 12  CREATININE 1.03 0.98  CALCIUM 9.3 8.6*   GFR: Estimated Creatinine Clearance: 94 mL/min (by C-G formula based on SCr of 0.98 mg/dL). Liver Function Tests: Recent Labs  Lab 02/28/17 1800  AST 36  ALT 49  ALKPHOS 68  BILITOT 1.2  PROT 7.3  ALBUMIN 3.9   No results for input(s): LIPASE, AMYLASE in the last 168 hours. No results for input(s): AMMONIA in the last 168 hours. Coagulation Profile: No results for input(s): INR,  PROTIME in the last 168 hours. Cardiac Enzymes: No results for input(s): CKTOTAL, CKMB, CKMBINDEX, TROPONINI in the last 168 hours. BNP (last 3 results) No results for input(s): PROBNP in the last 8760 hours. HbA1C: No results for input(s): HGBA1C in the last 72 hours. CBG: No results for input(s): GLUCAP in the last 168 hours. Lipid Profile: No results for input(s): CHOL, HDL, LDLCALC, TRIG, CHOLHDL, LDLDIRECT in the last 72 hours. Thyroid Function Tests: No results for input(s): TSH, T4TOTAL, FREET4, T3FREE, THYROIDAB in the  last 72 hours. Anemia Panel: No results for input(s): VITAMINB12, FOLATE, FERRITIN, TIBC, IRON, RETICCTPCT in the last 72 hours. Sepsis Labs: Recent Labs  Lab 02/28/17 1820 02/28/17 2112  LATICACIDVEN 1.30 0.49*    No results found for this or any previous visit (from the past 240 hour(s)).       Radiology Studies: Dg Abdomen 1 View  Result Date: 02/28/2017 CLINICAL DATA:  56 year old male with difficulty urinating. EXAM: ABDOMEN - 1 VIEW COMPARISON:  Ultrasound dated 02/28/2017 FINDINGS: There is moderate stool throughout the colon. No bowel dilatation or evidence of obstruction. Multiple radiopaque foci over the pelvis noted which may represent pelvic calcifications and phleboliths. Distal ureteral calculi is not entirely excluded. A 7 mm ovoid radiopaque density in the right hemipelvis likely represent fecal material or a diverticulith. No radiopaque calculi noted over the flank. The osseous structures and soft tissues are grossly unremarkable. IMPRESSION: 1. No definite radiopaque calculi over the flank. Scattered radiopaque foci over the pelvis may represent pelvic phleboliths. 2. No bowel dilatation or evidence of obstruction. Electronically Signed   By: Anner Crete M.D.   On: 02/28/2017 19:34   US Renal  Result Date: 02/28/2017 CLINICAL DATA:  56 year old male with urinary retention and urgency. EXAM: RENAL / URINARY TRACT ULTRASOUND COMPLETE COMPARISON:  None. FINDINGS: Right Kidney: Length: 11.8 cm. Normal echogenicity. No hydronephrosis or echogenic stone. Left Kidney: Length: 13.4 cm. Normal echogenicity. No hydronephrosis or echogenic stone. Bladder: The urinary bladder is decompressed around a Foley catheter and poorly visualized. IMPRESSION: Unremarkable renal ultrasound. Electronically Signed   By: Anner Crete M.D.   On: 02/28/2017 19:28        Scheduled Meds: . tamsulosin  0.4 mg Oral Daily   Continuous Infusions: . sodium chloride 125 mL/hr at 03/01/17  0813  . cefTRIAXone (ROCEPHIN)  IV Stopped (03/01/17 6301)     LOS: 0 days        Aline August, MD [facility] Pager [phone]  If 7PM-7AM, please contact night-coverage [email or web address] Password [email or web address] 03/01/2017, 10:26 AM

## 2017-03-01 NOTE — Progress Notes (Signed)
Pt arrived to unit room 1514 via stretcher. VS taken, pt oriented to room and call bell with no complications. Alert and oriented x 4, steady gait.no complaint of pain. Initial assessment completed, pt guide at the bedside. Will continue to monitor.

## 2017-03-01 NOTE — Consult Note (Signed)
Urology Consult  Referring physician: Hal Hope Reason for referral: Urinary retention and possible acute prostatitis  History of Present Illness: Antonio Murphy is 56 years of age.  He has recently moved to the [place] area from [place] approximately 5-6 weeks ago.  Several weeks ago he began experiencing some dysuria and increased urinary frequency.  He was seen in urgent care where he was told to get a urinary tract infection" and was started on Macrobid.  His symptoms worsened and he developed fever and chills.  In the emergency room he was noted to have a leukocytosis and a urinalysis which did show pyuria and some bacteriuria.  He also was noted to be in urinary retention and a Foley catheter was placed with a 700 cc residual.  He tells me as a baseline he does have moderate voiding symptoms with nocturia 1-3 times per evening.  Some urinary hesitancy and a weak urinary stream.  He reports a previous episode of "urinary tract infection" 3-4 years ago.  He is started a new job and is fairly insistent on nonsustained pass this afternoon here in the hospital.  Past Medical History:  Diagnosis Date  . Benign tumor   . Hypertension    Past Surgical History:  Procedure Laterality Date  . abdominal adhesions    . ABDOMINAL SURGERY    . SPLENECTOMY    . SPLENECTOMY      Medications:  Prior to Admission:  Medications Prior to Admission  Medication Sig Dispense Refill Last Dose  . amLODipine-benazepril (LOTREL) 5-20 MG capsule Take 1 capsule daily by mouth.   02/28/2017 at Unknown time  . aspirin EC 81 MG tablet Take 81 mg daily by mouth.   02/28/2017 at Unknown time  . ibuprofen (ADVIL,MOTRIN) 200 MG tablet Take 400 mg every 6 (six) hours as needed by mouth for moderate pain.   02/27/2017 at Unknown time  . nitrofurantoin, macrocrystal-monohydrate, (MACROBID) 100 MG capsule Take 100 mg 2 (two) times daily by mouth.   02/28/2017 at Unknown time  . ciprofloxacin (CIPRO) 500 MG tablet Take 1 tablet  (500 mg total) 2 (two) times daily for 10 days by mouth. 20 tablet 0    Scheduled: . tamsulosin  0.4 mg Oral Daily    Allergies: No Known Allergies  Family History  Problem Relation Age of Onset  . Lung cancer Mother   . Aneurysm Father     Social History:  reports that  has never smoked. he has never used smokeless tobacco. He reports that he drinks alcohol. He reports that he does not use drugs.  ROS positive for the obstructive and irritative voiding symptoms listed above.  No gross hematuria.  Positive for fever chills otherwise negative. Physical Exam:  Vital signs in last 24 hours: Temp:  [98.4 F (36.9 C)-102.5 F (39.2 C)] 99.5 F (37.5 C) (11/12 0431) Pulse Rate:  [65-94] 82 (11/12 0431) Resp:  [14-20] 16 (11/12 0431) BP: (104-147)/(72-94) 107/74 (11/12 0431) SpO2:  [96 %-99 %] 97 % (11/12 0431) Weight:  [84.8 kg (187 lb)] 84.8 kg (187 lb) (11/11 1810)  Constitutional: Vital signs reviewed. WD WN in NAD Head: Normocephalic and atraumatic   Eyes: PERRL, No scleral icterus.  Neck: Supple No  Gross JVD, mass, thyromegaly, or carotid bruit present.  Cardiovascular: RRR Pulmonary/Chest: Normal effort Abdominal: Soft. Non-tender, non-distended, bowel sounds are normal, no masses, organomegaly, or guarding present.  Genitourinary: Circumcised penis with indwelling Foley catheter.  Rectal exam deferred at this time Extremities: No cyanosis or edema  Neurological: Grossly non-focal.  Skin: Warm,very dry and intact. No rash, cyanosis   Laboratory Data:  Results for orders placed or performed during the hospital encounter of 02/28/17 (from the past 72 hour(s))  CBC with Differential/Platelet     Status: Abnormal   Collection Time: 02/28/17  6:00 PM  Result Value Ref Range   WBC 18.9 (H) 4.0 - 10.5 K/uL   RBC 4.40 4.22 - 5.81 MIL/uL   Hemoglobin 14.1 13.0 - 17.0 g/dL   HCT 40.6 39.0 - 52.0 %   MCV 92.3 78.0 - 100.0 fL   MCH 32.0 26.0 - 34.0 pg   MCHC 34.7 30.0 - 36.0  g/dL   RDW 13.7 11.5 - 15.5 %   Platelets 281 150 - 400 K/uL   Neutrophils Relative % 87 %   Neutro Abs 16.4 (H) 1.7 - 7.7 K/uL   Lymphocytes Relative 5 %   Lymphs Abs 1.0 0.7 - 4.0 K/uL   Monocytes Relative 8 %   Monocytes Absolute 1.4 (H) 0.1 - 1.0 K/uL   Eosinophils Relative 0 %   Eosinophils Absolute 0.1 0.0 - 0.7 K/uL   Basophils Relative 0 %   Basophils Absolute 0.0 0.0 - 0.1 K/uL  Comprehensive metabolic panel     Status: Abnormal   Collection Time: 02/28/17  6:00 PM  Result Value Ref Range   Sodium 135 135 - 145 mmol/L   Potassium 3.8 3.5 - 5.1 mmol/L   Chloride 100 (L) 101 - 111 mmol/L   CO2 24 22 - 32 mmol/L   Glucose, Bld 117 (H) 65 - 99 mg/dL   BUN 12 6 - 20 mg/dL   Creatinine, Ser 1.03 0.61 - 1.24 mg/dL   Calcium 9.3 8.9 - 10.3 mg/dL   Total Protein 7.3 6.5 - 8.1 g/dL   Albumin 3.9 3.5 - 5.0 g/dL   AST 36 15 - 41 U/L   ALT 49 17 - 63 U/L   Alkaline Phosphatase 68 38 - 126 U/L   Total Bilirubin 1.2 0.3 - 1.2 mg/dL   GFR calc non Af Amer >60 >60 mL/min   GFR calc Af Amer >60 >60 mL/min    Comment: (NOTE) The eGFR has been calculated using the CKD EPI equation. This calculation has not been validated in all clinical situations. eGFR's persistently <60 mL/min signify possible Chronic Kidney Disease.    Anion gap 11 5 - 15  Urinalysis, Routine w reflex microscopic     Status: Abnormal   Collection Time: 02/28/17  6:01 PM  Result Value Ref Range   Color, Urine YELLOW YELLOW   APPearance CLEAR CLEAR   Specific Gravity, Urine 1.010 1.005 - 1.030   pH 5.0 5.0 - 8.0   Glucose, UA NEGATIVE NEGATIVE mg/dL   Hgb urine dipstick SMALL (A) NEGATIVE   Bilirubin Urine NEGATIVE NEGATIVE   Ketones, ur 5 (A) NEGATIVE mg/dL   Protein, ur NEGATIVE NEGATIVE mg/dL   Nitrite NEGATIVE NEGATIVE   Leukocytes, UA TRACE (A) NEGATIVE   RBC / HPF 0-5 0 - 5 RBC/hpf   WBC, UA 6-30 0 - 5 WBC/hpf   Bacteria, UA FEW (A) NONE SEEN   Squamous Epithelial / LPF NONE SEEN NONE SEEN   Mucus  PRESENT   I-Stat CG4 Lactic Acid, ED     Status: None   Collection Time: 02/28/17  6:20 PM  Result Value Ref Range   Lactic Acid, Venous 1.30 0.5 - 1.9 mmol/L  I-Stat CG4 Lactic Acid, ED  Status: Abnormal   Collection Time: 02/28/17  9:12 PM  Result Value Ref Range   Lactic Acid, Venous 0.49 (L) 0.5 - 1.9 mmol/L  Basic metabolic panel     Status: Abnormal   Collection Time: 03/01/17  5:27 AM  Result Value Ref Range   Sodium 136 135 - 145 mmol/L   Potassium 3.9 3.5 - 5.1 mmol/L   Chloride 107 101 - 111 mmol/L   CO2 22 22 - 32 mmol/L   Glucose, Bld 99 65 - 99 mg/dL   BUN 12 6 - 20 mg/dL   Creatinine, Ser 0.98 0.61 - 1.24 mg/dL   Calcium 8.6 (L) 8.9 - 10.3 mg/dL   GFR calc non Af Amer >60 >60 mL/min   GFR calc Af Amer >60 >60 mL/min    Comment: (NOTE) The eGFR has been calculated using the CKD EPI equation. This calculation has not been validated in all clinical situations. eGFR's persistently <60 mL/min signify possible Chronic Kidney Disease.    Anion gap 7 5 - 15  CBC     Status: Abnormal   Collection Time: 03/01/17  5:27 AM  Result Value Ref Range   WBC 16.0 (H) 4.0 - 10.5 K/uL   RBC 3.95 (L) 4.22 - 5.81 MIL/uL   Hemoglobin 12.5 (L) 13.0 - 17.0 g/dL   HCT 36.6 (L) 39.0 - 52.0 %   MCV 92.7 78.0 - 100.0 fL   MCH 31.6 26.0 - 34.0 pg   MCHC 34.2 30.0 - 36.0 g/dL   RDW 13.8 11.5 - 15.5 %   Platelets 277 150 - 400 K/uL   No results found for this or any previous visit (from the past 240 hour(s)). Creatinine: Recent Labs    02/28/17 1800 03/01/17 0527  CREATININE 1.03 0.98   Baseline Creatinine:    Impression/Assessment:  Urinary retention secondary to acute prostatitis on top of baseline BPH/bladder neck obstruction.  The patient is fairly insistent on not staying in the hospital beyond this afternoon.  He probably could be transitioned to oral antibiotics with Septra DS for a flora quinolone.  Macrobid is a poor choice for prostatitis given lack of tissue  penetration.  He should continue with alpha-blocker therapy and this may be a long-term strategy for him.  He should be discharged with the indwelling Foley catheter to a leg bag.  He should contact [facility] for a voiding trial/follow-up in 48-72 hours.  Plan:  As above.  Bernestine Amass 03/01/2017, 7:44 AM

## 2017-03-01 NOTE — Progress Notes (Signed)
Pt c/o leakage from foley. Leak coming from small port on tubing. Drainage bag changed.

## 2017-03-02 DIAGNOSIS — R6510 Systemic inflammatory response syndrome (SIRS) of non-infectious origin without acute organ dysfunction: Secondary | ICD-10-CM | POA: Diagnosis not present

## 2017-03-02 DIAGNOSIS — R339 Retention of urine, unspecified: Secondary | ICD-10-CM | POA: Diagnosis not present

## 2017-03-02 DIAGNOSIS — N3000 Acute cystitis without hematuria: Secondary | ICD-10-CM | POA: Diagnosis not present

## 2017-03-02 DIAGNOSIS — I10 Essential (primary) hypertension: Secondary | ICD-10-CM | POA: Diagnosis not present

## 2017-03-02 DIAGNOSIS — D72829 Elevated white blood cell count, unspecified: Secondary | ICD-10-CM

## 2017-03-02 LAB — BASIC METABOLIC PANEL
ANION GAP: 4 — AB (ref 5–15)
BUN: 10 mg/dL (ref 6–20)
CHLORIDE: 108 mmol/L (ref 101–111)
CO2: 25 mmol/L (ref 22–32)
Calcium: 8.6 mg/dL — ABNORMAL LOW (ref 8.9–10.3)
Creatinine, Ser: 1.05 mg/dL (ref 0.61–1.24)
GFR calc non Af Amer: >60 mL/min (ref >60)
Glucose, Bld: 122 mg/dL — ABNORMAL HIGH (ref 65–99)
POTASSIUM: 3.9 mmol/L (ref 3.5–5.1)
SODIUM: 137 mmol/L (ref 135–145)

## 2017-03-02 LAB — MAGNESIUM: MAGNESIUM: 2 mg/dL (ref 1.7–2.4)

## 2017-03-02 LAB — CBC WITH DIFFERENTIAL/PLATELET
BASOS PCT: 0 %
Basophils Absolute: 0 10*3/uL (ref 0.0–0.1)
Eosinophils Absolute: 0.3 10*3/uL (ref 0.0–0.7)
Eosinophils Relative: 3 %
HEMATOCRIT: 36.1 % — AB (ref 39.0–52.0)
HEMOGLOBIN: 12.3 g/dL — AB (ref 13.0–17.0)
Lymphocytes Relative: 13 %
Lymphs Abs: 1.4 10*3/uL (ref 0.7–4.0)
MCH: 31.4 pg (ref 26.0–34.0)
MCHC: 34.1 g/dL (ref 30.0–36.0)
MCV: 92.1 fL (ref 78.0–100.0)
MONOS PCT: 13 %
Monocytes Absolute: 1.4 10*3/uL — ABNORMAL HIGH (ref 0.1–1.0)
NEUTROS ABS: 7.5 10*3/uL (ref 1.7–7.7)
NEUTROS PCT: 71 %
Platelets: 309 10*3/uL (ref 150–400)
RBC: 3.92 MIL/uL — AB (ref 4.22–5.81)
RDW: 13.6 % (ref 11.5–15.5)
WBC: 10.6 10*3/uL — AB (ref 4.0–10.5)

## 2017-03-02 MED ORDER — TAMSULOSIN HCL 0.4 MG PO CAPS: 0.4000 mg | ORAL_CAPSULE | Freq: Every day | ORAL | 0 refills | Status: DC

## 2017-03-02 NOTE — Discharge Instructions (Signed)
Acute Urinary Retention, Male Acute urinary retention is when you are unable to pee (urinate). Acute urinary retention is common in older men. Prostates can get bigger, which blocks the flow of pee. Follow these instructions at home:  Drink enough fluids to keep your pee clear or pale yellow.  If you are sent home with a tube that drains the bladder (catheter), there will be a drainage bag attached to it. There are two types of bags. One is big that you can wear at night without having to empty it. One is smaller and needs to be emptied more often. ? Keep the drainage bag empty. ? Keep the drainage bag lower than your catheter.  Only take medicine as told by your doctor. Contact a doctor if:  You have a low-grade fever.  You have spasms or you are leaking pee when you have spasms. Get help right away if:  You have chills or a fever.  Your catheter stops draining pee.  Your catheter falls out.  You have increased bleeding that does not stop after you have rested and increased the amount of fluids you had been drinking. This information is not intended to replace advice given to you by your health care provider. Make sure you discuss any questions you have with your health care provider. Document Released: 09/23/2007 Document Revised: 09/12/2015 Document Reviewed: 09/15/2012 Elsevier Interactive Patient Education  2017 [employer].   Indwelling Urinary Catheter Insertion, Care After This sheet gives you information about how to care for yourself after your procedure. Your health care provider may also give you more specific instructions. If you have problems or questions, contact your health care provider. What can I expect after the procedure? After the procedure, it is common to have:  Slight discomfort around your urethra where the catheter enters your body.  Follow these instructions at home:  Keep the drainage bag at or below the level of your bladder. Doing this ensures  that urine can only drain out, not back into your body.  Secure the catheter tubing and drainage bag to your leg or thigh to keep it from moving.  Check the catheter tubing regularly to make sure there are no kinks or blockages.  Take showers daily to keep the catheter clean. Do not take a bath.  Do not pull on your catheter or try to remove it.  Disconnect the tubing and drainage bag as little as possible.  Empty the drainage bag every 2-4 hours, or more often if needed. Do not let the bag get completely full.  Wash your hands with soap and water before and after touching the catheter, tubing, or drainage bag.  Do not let the drainage bag or catheter tubing touch the floor.  Drink enough fluids to keep your urine clear or pale yellow, or as told by your health care provider. Contact a health care provider if:  Urine stops flowing into the drainage bag.  You feel pain or pressure in the bladder area.  You have back pain.  Your catheter gets clogged.  Your catheter starts to leak.  Your urine looks cloudy.  Your drainage bag or tubing looks dirty.  You notice a bad smell when emptying your drainage bag. Get help right away if:  You have a fever or chills.  You have severe pain in your back or your lower abdomen.  You have warmth, redness, swelling, or pain in the urethra area.  You notice blood in your urine.  Your catheter gets  pulled out. Summary  Do not pull on your catheter or try to remove it.  Keep the drainage bag at or below the level of your bladder, but do not let the drainage bag or catheter tubing touch the floor.  Wash your hands with soap and water before and after touching the catheter, tubing, or drainage bag.  Contact your health care provider if you have a fever, chills, or any other signs of infection. This information is not intended to replace advice given to you by your health care provider. Make sure you discuss any questions you have with  your health care provider. Document Released: 05/16/2016 Document Revised: 05/16/2016 Document Reviewed: 05/16/2016 Elsevier Interactive Patient Education  [employer].

## 2017-03-02 NOTE — Progress Notes (Signed)
Patient is discharged to home with Foley catheter as urologist ordered. Discharge instructions including medication was given to patient; foley care education was performed to patient by RN; patient has no question. Patient is going to home byself.

## 2017-03-02 NOTE — Discharge Summary (Addendum)
Physician Discharge Summary  Gibson Lad URK:[id number] DOB: [birth year] DOA: 02/28/2017  PCP: System, Pcp Not In  Admit date: 02/28/2017 Discharge date: 03/02/2017  Admitted From: Home  Disposition:  home  Recommendations for Outpatient Follow-up:  1. Follow up with PCP in 1week with repeat CBC/BMP 2. Follow-up with urology at earliest convenience 3. 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  He was found to be in urinary retention for which Foley catheter was placed and antibiotics started.  Urology was consulted.  Urology recommended to continue Foley catheter and outpatient follow-up with urology.  Flomax was started.  Leukocytosis resolved.  Urine culture is growing Enterobacter aerogenes sensitivity is pending.  Patient really wants to go home today.  He will be sent home on ciprofloxacin 500 mg p.o. twice daily for 10 days.    Discharge Diagnoses:  Active Problems:   SIRS (systemic inflammatory response syndrome) (HCC)   Urinary retention   Acute cystitis without hematuria   Hypertension    1. SIRS secondary to UTI and probably Acute prostatitis- currently on Rocephin. Urine culture is growing Enterobacter aerogenes sensitivity is pending.  He had a temperature spike yesterday afternoon but he really wants to go home today.   Discharge home on ciprofloxacin 500 mg p.o. twice daily for 10 days follow cultures. Hemodynamically stable for now.  If symptoms worsen, asked the patient to return to the ED. Final sensitivities can be followed by Urology and/or PCP 2. Urinary  retention-continue Foley catheter.  Urology consult appreciated.  Outpatient follow-up with urology. Continue flomax.  3. Leukocytosis likely from infection and possible splenectomy- Resolved 4. History of hypertension- continue Amlodipine/Benazepril  5. 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   Discharge Instructions  Discharge Instructions    Call MD for:  difficulty breathing, headache or visual disturbances   Complete by:  As directed    Call MD for:  extreme fatigue   Complete by:  As directed    Call MD for:  hives   Complete by:  As directed    Call MD for:  persistant dizziness or light-headedness   Complete by:  As directed    Call MD for:  persistant nausea and vomiting   Complete by:  As directed    Call MD for:  severe uncontrolled pain   Complete by:  As directed    Call MD for:  temperature >100.4   Complete by:  As directed    Diet - low sodium heart healthy   Complete by:  As directed    Increase activity slowly   Complete by:  As directed      Allergies as of 03/02/2017   No Known Allergies     Medication List    STOP taking these medications   nitrofurantoin (macrocrystal-monohydrate) 100 MG capsule Commonly known as:  MACROBID     TAKE these medications   amLODipine-benazepril 5-20 MG capsule Commonly known as:  LOTREL Take 1 capsule daily by mouth.   aspirin EC 81 MG tablet Take 81 mg daily  by mouth.   ciprofloxacin 500 MG tablet Commonly known as:  CIPRO Take 1 tablet (500 mg total) 2 (two) times daily for 10 days by mouth.   ibuprofen 200 MG tablet Commonly known as:  ADVIL,MOTRIN Take 400 mg every 6 (six) hours as needed by mouth for moderate pain.   tamsulosin 0.4 MG Caps capsule Commonly known as:  FLOMAX Take 1 capsule (0.4 mg total) daily by mouth. Start taking on:  03/03/2017       Follow-up Information    Rana Snare, MD. Schedule an appointment  as soon as possible for a visit.   Specialty:  Urology Why:  at earliest convenience Contact information: [place] [address] [phone]          No Known Allergies  Consultations:  Urology   Procedures/Studies: Dg Abdomen 1 View  Result Date: 02/28/2017 CLINICAL DATA:  56 year old male with difficulty urinating. EXAM: ABDOMEN - 1 VIEW COMPARISON:  Ultrasound dated 02/28/2017 FINDINGS: There is moderate stool throughout the colon. No bowel dilatation or evidence of obstruction. Multiple radiopaque foci over the pelvis noted which may represent pelvic calcifications and phleboliths. Distal ureteral calculi is not entirely excluded. A 7 mm ovoid radiopaque density in the right hemipelvis likely represent fecal material or a diverticulith. No radiopaque calculi noted over the flank. The osseous structures and soft tissues are grossly unremarkable. IMPRESSION: 1. No definite radiopaque calculi over the flank. Scattered radiopaque foci over the pelvis may represent pelvic phleboliths. 2. No bowel dilatation or evidence of obstruction. Electronically Signed   By: Anner Crete M.D.   On: 02/28/2017 19:34   US Renal  Result Date: 02/28/2017 CLINICAL DATA:  56 year old male with urinary retention and urgency. EXAM: RENAL / URINARY TRACT ULTRASOUND COMPLETE COMPARISON:  None. FINDINGS: Right Kidney: Length: 11.8 cm. Normal echogenicity. No hydronephrosis or echogenic stone. Left Kidney: Length: 13.4 cm. Normal echogenicity. No hydronephrosis or echogenic stone. Bladder: The urinary bladder is decompressed around a Foley catheter and poorly visualized. IMPRESSION: Unremarkable renal ultrasound. Electronically Signed   By: Anner Crete M.D.   On: 02/28/2017 19:28     Subjective: Patient seen and examined at bedside.  He feels better and is tolerating diet.  Denies any abdominal pain.  He wants to go home.  Discharge Exam: Vitals:   03/01/17 2010 03/02/17 0644  BP:  126/69 127/88  Pulse: 74 73  Resp: (!) 22 20  Temp: 98.6 F (37 C) 98.7 F (37.1 C)  SpO2: 98% 96%   Vitals:   03/01/17 1443 03/01/17 1800 03/01/17 2010 03/02/17 0644  BP: 137/82  126/69 127/88  Pulse: (!) 106  74 73  Resp: 20  (!) 22 20  Temp: (!) 102.4 F (39.1 C) 98.3 F (36.8 C) 98.6 F (37 C) 98.7 F (37.1 C)  TempSrc: Oral  Oral Oral  SpO2: 98%  98% 96%  Weight:      Height:        General: Pt is alert, awake, not in acute distress Cardiovascular: Rate controlled, S1/S2 + Respiratory: Bilateral decreased breath sounds at bases Abdominal: Soft, NT, ND, bowel sounds + Extremities: no edema, no cyanosis    The results of significant diagnostics from this hospitalization (including imaging, microbiology, ancillary and laboratory) are listed below for reference.     Microbiology: Recent Results (from the past 240 hour(s))  Urine culture     Status: Abnormal (Preliminary result)   Collection Time: 02/28/17  4:15 PM  Result Value Ref  Range Status   Specimen Description URINE, CLEAN CATCH  Final   Special Requests NONE  Final   Culture 80,000 COLONIES/mL ENTEROBACTER AEROGENES (A)  Final   Report Status PENDING  Incomplete  Urine culture     Status: Abnormal (Preliminary result)   Collection Time: 02/28/17  6:01 PM  Result Value Ref Range Status   Specimen Description URINE, RANDOM  Final   Special Requests NONE  Final   Culture 40,000 COLONIES/mL ENTEROBACTER AEROGENES (A)  Final   Report Status PENDING  Incomplete  Blood culture (routine x 2)     Status: None (Preliminary result)   Collection Time: 02/28/17  6:06 PM  Result Value Ref Range Status   Specimen Description BLOOD RIGHT FOREARM  Final   Special Requests   Final    BOTTLES DRAWN AEROBIC AND ANAEROBIC Blood Culture adequate volume   Culture   Final    NO GROWTH 1 DAY Performed at [facility], [place] [address], [place], [place] [address]    Report Status PENDING  Incomplete  Blood culture  (routine x 2)     Status: None (Preliminary result)   Collection Time: 02/28/17  6:18 PM  Result Value Ref Range Status   Specimen Description RIGHT ANTECUBITAL  Final   Special Requests   Final    BOTTLES DRAWN AEROBIC AND ANAEROBIC Blood Culture adequate volume   Culture   Final    NO GROWTH 1 DAY Performed at [facility], [place] [address] [place], [place] [address]    Report Status PENDING  Incomplete     Labs: BNP (last 3 results) No results for input(s): BNP in the last 8760 hours. Basic Metabolic Panel: Recent Labs  Lab 02/28/17 1800 03/01/17 0527 03/02/17 0527  NA 135 136 137  K 3.8 3.9 3.9  CL 100* 107 108  CO2 24 22 25   GLUCOSE 117* 99 122*  BUN 12 12 10   CREATININE 1.03 0.98 1.05  CALCIUM 9.3 8.6* 8.6*  MG  --   --  2.0   Liver Function Tests: Recent Labs  Lab 02/28/17 1800  AST 36  ALT 49  ALKPHOS 68  BILITOT 1.2  PROT 7.3  ALBUMIN 3.9   No results for input(s): LIPASE, AMYLASE in the last 168 hours. No results for input(s): AMMONIA in the last 168 hours. CBC: Recent Labs  Lab 02/28/17 1800 03/01/17 0527 03/02/17 0527  WBC 18.9* 16.0* 10.6*  NEUTROABS 16.4*  --  7.5  HGB 14.1 12.5* 12.3*  HCT 40.6 36.6* 36.1*  MCV 92.3 92.7 92.1  PLT 281 277 309   Cardiac Enzymes: No results for input(s): CKTOTAL, CKMB, CKMBINDEX, TROPONINI in the last 168 hours. BNP: Invalid input(s): POCBNP CBG: No results for input(s): GLUCAP in the last 168 hours. D-Dimer No results for input(s): DDIMER in the last 72 hours. Hgb A1c No results for input(s): HGBA1C in the last 72 hours. Lipid Profile No results for input(s): CHOL, HDL, LDLCALC, TRIG, CHOLHDL, LDLDIRECT in the last 72 hours. Thyroid function studies No results for input(s): TSH, T4TOTAL, T3FREE, THYROIDAB in the last 72 hours.  Invalid input(s): FREET3 Anemia work up No results for input(s): VITAMINB12, FOLATE, FERRITIN, TIBC, IRON, RETICCTPCT in the last 72 hours. Urinalysis     Component Value Date/Time   COLORURINE YELLOW 02/28/2017 1801   APPEARANCEUR CLEAR 02/28/2017 1801   LABSPEC 1.010 02/28/2017 1801   PHURINE 5.0 02/28/2017 1801   GLUCOSEU NEGATIVE 02/28/2017 1801   HGBUR SMALL (  A) 02/28/2017 1801   BILIRUBINUR NEGATIVE 02/28/2017 1801   KETONESUR 5 (A) 02/28/2017 1801   PROTEINUR NEGATIVE 02/28/2017 1801   UROBILINOGEN 0.2 02/28/2017 1615   NITRITE NEGATIVE 02/28/2017 1801   LEUKOCYTESUR TRACE (A) 02/28/2017 1801   Sepsis Labs Invalid input(s): PROCALCITONIN,  WBC,  LACTICIDVEN Microbiology Recent Results (from the past 240 hour(s))  Urine culture     Status: Abnormal (Preliminary result)   Collection Time: 02/28/17  4:15 PM  Result Value Ref Range Status   Specimen Description URINE, CLEAN CATCH  Final   Special Requests NONE  Final   Culture 80,000 COLONIES/mL ENTEROBACTER AEROGENES (A)  Final   Report Status PENDING  Incomplete  Urine culture     Status: Abnormal (Preliminary result)   Collection Time: 02/28/17  6:01 PM  Result Value Ref Range Status   Specimen Description URINE, RANDOM  Final   Special Requests NONE  Final   Culture 40,000 COLONIES/mL ENTEROBACTER AEROGENES (A)  Final   Report Status PENDING  Incomplete  Blood culture (routine x 2)     Status: None (Preliminary result)   Collection Time: 02/28/17  6:06 PM  Result Value Ref Range Status   Specimen Description BLOOD RIGHT FOREARM  Final   Special Requests   Final    BOTTLES DRAWN AEROBIC AND ANAEROBIC Blood Culture adequate volume   Culture   Final    NO GROWTH 1 DAY Performed at [facility], [place] [address] [place], [place] [address]    Report Status PENDING  Incomplete  Blood culture (routine x 2)     Status: None (Preliminary result)   Collection Time: 02/28/17  6:18 PM  Result Value Ref Range Status   Specimen Description RIGHT ANTECUBITAL  Final   Special Requests   Final    BOTTLES DRAWN AEROBIC AND ANAEROBIC Blood Culture adequate volume   Culture    Final    NO GROWTH 1 DAY Performed at [facility], [place] [address] [place], [place] [address]    Report Status PENDING  Incomplete     Time coordinating discharge: 35 minutes  SIGNED:   Aline August, MD  [facility] 03/02/2017, 10:44 AM Pager: [phone]  If 7PM-7AM, please contact night-coverage [email or web address] Password [email or web address]

## 2017-03-02 NOTE — Care Management Note (Signed)
Case Management Note  Patient Details  Name: Antonio Murphy MRN: [id number] Date of Birth: Jul 07, 1960  Subjective/Objective:                  hematuria  Action/Plan: Date: March 02, 2017 Velva Harman, BSN, [place], [place] Chart and notes review for patient progress and needs. Will follow for case management and discharge needs. Next review date: [id number]  Expected Discharge Date:  03/01/17               Expected Discharge Plan:  Home/Self Care  In-House Referral:     Discharge planning Services  CM Consult  Post Acute Care Choice:    Choice offered to:     DME Arranged:    DME Agency:     HH Arranged:    HH Agency:     Status of Service:  In process, will continue to follow  If discussed at Long Length of Stay Meetings, dates discussed:    Additional Comments:  Leeroy Cha, RN 03/02/2017, 9:22 AM

## 2017-03-03 LAB — URINE CULTURE: Culture: 80000 — AB

## 2017-03-06 LAB — CULTURE, BLOOD (ROUTINE X 2)
CULTURE: NO GROWTH
Culture: NO GROWTH
SPECIAL REQUESTS: ADEQUATE
Special Requests: ADEQUATE

## 2017-08-09 ENCOUNTER — Encounter: Payer: Self-pay | Admitting: Radiation Oncology

## 2017-08-12 ENCOUNTER — Other Ambulatory Visit: Payer: Self-pay | Admitting: Urology

## 2017-08-12 DIAGNOSIS — C61 Malignant neoplasm of prostate: Secondary | ICD-10-CM

## 2017-09-02 ENCOUNTER — Encounter: Payer: Self-pay | Admitting: Radiation Oncology

## 2017-09-02 NOTE — Progress Notes (Addendum)
GU Location of Tumor / Histology: prostatic adenocarcinoma  If Prostate Cancer, Gleason Score is (3 + 3) and PSA is (6.94). Prostate volume: 32.2 grams.  Antonio Murphy was consult by Dr. Risa Grill on 03/01/2017 after presenting to the ED with complaints of progressive difficulty voiding, dysuria, fever, and chills. The patient moved here from [place]. Patient seen by Dr. Lovena Neighbours after Dr. Risa Grill retired.  Biopsies of prostate (if applicable) revealed:    Past/Anticipated interventions by urology, if any: antibiotic, flomax, catheter, prostate biopsy  Past/Anticipated interventions by medical oncology, if any: no  Weight changes, if any: no  Bowel/Bladder complaints, if any: Reports occasional urgency, frequency and nocturia x1. Denies dysuria, hematuria, leakage or incontinence. IPSS 4.  Nausea/Vomiting, if any: no  Pain issues, if any:  no  SAFETY ISSUES:  Prior radiation? no  Pacemaker/ICD? no  Possible current pregnancy? no  Is the patient on methotrexate? no  Current Complaints / other details:  57 year old male. Married with one daughter and one son. Mother with hx of lung ca. 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 Resides in [place] "for now." Scheduled to follow up with [facility] in 2 weeks.

## 2017-09-06 ENCOUNTER — Ambulatory Visit
Admission: RE | Admit: 2017-09-06 | Discharge: 2017-09-06 | Disposition: A | Discharge status: Home / Self Care | Payer: Commercial Managed Care - PPO | Source: Ambulatory Visit | Attending: Radiation Oncology | Admitting: Radiation Oncology

## 2017-09-06 ENCOUNTER — Other Ambulatory Visit: Payer: Self-pay

## 2017-09-06 ENCOUNTER — Encounter

## 2017-09-06 ENCOUNTER — Encounter: Payer: Self-pay | Admitting: Radiation Oncology

## 2017-09-06 ENCOUNTER — Encounter: Payer: Self-pay | Admitting: Medical Oncology

## 2017-09-06 VITALS — BP 136/93 | HR 62 | Temp 98.0°F | Resp 18 | Ht 73.0 in | Wt 191.4 lb

## 2017-09-06 DIAGNOSIS — Z79899 Other long term (current) drug therapy: Secondary | ICD-10-CM | POA: Insufficient documentation

## 2017-09-06 DIAGNOSIS — R509 Fever, unspecified: Secondary | ICD-10-CM | POA: Diagnosis not present

## 2017-09-06 DIAGNOSIS — R300 Dysuria: Secondary | ICD-10-CM | POA: Insufficient documentation

## 2017-09-06 DIAGNOSIS — C61 Malignant neoplasm of prostate: Secondary | ICD-10-CM | POA: Insufficient documentation

## 2017-09-06 DIAGNOSIS — I10 Essential (primary) hypertension: Secondary | ICD-10-CM | POA: Diagnosis not present

## 2017-09-06 DIAGNOSIS — Z9081 Acquired absence of spleen: Secondary | ICD-10-CM | POA: Diagnosis not present

## 2017-09-06 HISTORY — DX: Malignant neoplasm of prostate: C61

## 2017-09-06 NOTE — Progress Notes (Signed)
See progress note under physician encounter. 

## 2017-09-06 NOTE — Progress Notes (Signed)
Radiation Oncology         ([phone]) [phone] ________________________________  Initial Outpatient Consultation  Name: Antonio Murphy MRN: [id number]  Date: 09/06/2017  DOB: [birth year]  [id number], Pcp Not In  Winter, Christopher Aar*   REFERRING PHYSICIAN: Davis Gourd*  DIAGNOSIS: 57 y.o. gentleman with a low risk, Stage T1c adenocarcinoma of the prostate with Gleason Score of 3+3, and PSA of 6.94    ICD-10-CM   1. Malignant neoplasm of prostate ([place]) C61     HISTORY OF PRESENT ILLNESS: Antonio Murphy is a 57 y.o. male with a diagnosis of prostate cancer. He presented to the ED on 02/28/17 with complaints of progressive difficulty voiding, dysuria, fever, and chills. Accordingly, he was referred for evaluation in urology by Dr. Risa Grill on 03/01/17, and he was treated with Tamsulosin and Cipro. The patient was then referred to Dr. Lovena Neighbours upon Dr. Cy Blamer retirement. He presented to Dr. Lovena Neighbours on 03/18/17 and was noted to have reducing urinary symptoms at this time. The patient followed up with Dr. Lovena Neighbours on 07/15/17 with an elevated PSA of 4.87. A digital rectal examination was performed at that time revealing no nodularity. The patient proceeded to transrectal ultrasound with 12 biopsies of the prostate on 08/02/17. Repeat PSA was 6.94 at this time. The prostate volume measured 32.2 cc. Out of 12 core biopsies, 3 were positive. The maximum Gleason score was 3+3, and this was seen in right base, right mid, and right base lateral.    The patient reviewed the biopsy results with his urologist and he has kindly been referred today for discussion of potential radiation treatment options.   PREVIOUS RADIATION THERAPY: No  PAST MEDICAL HISTORY:  Past Medical History:  Diagnosis Date  . Benign tumor   . Hypertension   . Prostate cancer ([place])       PAST SURGICAL HISTORY: Past Surgical History:  Procedure Laterality Date  . abdominal adhesions    . ABDOMINAL SURGERY    . PROSTATE  BIOPSY    . SPLENECTOMY    . SPLENECTOMY      FAMILY HISTORY:  Family History  Problem Relation Age of Onset  . Lung cancer Mother   . Aneurysm Father     SOCIAL HISTORY:  Social History   Socioeconomic History  . Marital status: Married    Spouse name: Not on file  . Number of children: Not on file  . Years of education: Not on file  . Highest education level: Not on file  Occupational History  . Not on file  Social Needs  . Financial resource strain: Not on file  . Food insecurity:    Worry: Not on file    Inability: Not on file  . Transportation needs:    Medical: Not on file    Non-medical: Not on file  Tobacco Use  . Smoking status: Never Smoker  . Smokeless tobacco: Never Used  Substance and Sexual Activity  . Alcohol use: Yes    Comment: Ocassionally.  . Drug use: No  . Sexual activity: Yes  Lifestyle  . Physical activity:    Days per week: Not on file    Minutes per session: Not on file  . Stress: Not on file  Relationships  . Social connections:    Talks on phone: Not on file    Gets together: Not on file    Attends religious service: Not on file    Active member of club or organization: Not on file  Attends meetings of clubs or organizations: Not on file    Relationship status: Not on file  . Intimate partner violence:    Fear of current or ex partner: Not on file    Emotionally abused: Not on file    Physically abused: Not on file    Forced sexual activity: Not on file  Other Topics Concern  . Not on file  Social History Narrative  . Not on file    ALLERGIES: Patient has no known allergies.  MEDICATIONS:  Current Outpatient Medications  Medication Sig Dispense Refill  . amLODipine-benazepril (LOTREL) 5-20 MG capsule Take 1 capsule daily by mouth.    Marland Kitchen ibuprofen (ADVIL,MOTRIN) 200 MG tablet Take 400 mg every 6 (six) hours as needed by mouth for moderate pain.    . tamsulosin (FLOMAX) 0.4 MG CAPS capsule Take 1 capsule (0.4 mg total)  daily by mouth. 30 capsule 0   No current facility-administered medications for this encounter.     REVIEW OF SYSTEMS:  On review of systems, the patient reports that he is doing well overall. He denies any chest pain, shortness of breath, cough, fevers, chills, night sweats, unintended weight changes. He denies any bowel disturbances, and denies abdominal pain, nausea or vomiting. He denies any new musculoskeletal or joint aches or pains. His IPSS was 4, indicating mild urinary symptoms. He is able to complete sexual activity with most attempts. A complete review of systems is obtained and is otherwise negative.     PHYSICAL EXAM:  Wt Readings from Last 3 Encounters:  09/06/17 191 lb 6.4 oz (86.8 kg)  02/28/17 187 lb (84.8 kg)   Temp Readings from Last 3 Encounters:  09/06/17 98 F (36.7 C) (Oral)  03/02/17 98.7 F (37.1 C) (Oral)  02/28/17 100 F (37.8 C) (Oral)   BP Readings from Last 3 Encounters:  09/06/17 (!) 136/93  03/02/17 127/88  02/28/17 113/85   Pulse Readings from Last 3 Encounters:  09/06/17 62  03/02/17 73  02/28/17 93   Pain Assessment Pain Score: 0-No pain/10  In general this is a well appearing caucasian gentleman in no acute distress. He's alert and oriented x4 and appropriate throughout the examination. Cardiopulmonary assessment is negative for acute distress and he exhibits normal effort.   KPS = 100  100 - Normal; no complaints; no evidence of disease. 90   - Able to carry on normal activity; minor signs or symptoms of disease. 80   - Normal activity with effort; some signs or symptoms of disease. 68   - Cares for self; unable to carry on normal activity or to do active work. 60   - Requires occasional assistance, but is able to care for most of his personal needs. 50   - Requires considerable assistance and frequent medical care. 76   - Disabled; requires special care and assistance. 42   - Severely disabled; hospital admission is indicated  although death not imminent. 61   - Very sick; hospital admission necessary; active supportive treatment necessary. 10   - Moribund; fatal processes progressing rapidly. 0     - Dead  Karnofsky DA, Abelmann [place], Craver LS and Burchenal [facility] [phone]) The use of the nitrogen mustards in the palliative treatment of carcinoma: with particular reference to bronchogenic carcinoma Cancer 1 634-56  LABORATORY DATA:  Lab Results  Component Value Date   WBC 10.6 (H) 03/02/2017   HGB 12.3 (L) 03/02/2017   HCT 36.1 (L) 03/02/2017   MCV 92.1 03/02/2017  PLT 309 03/02/2017   Lab Results  Component Value Date   NA 137 03/02/2017   K 3.9 03/02/2017   CL 108 03/02/2017   CO2 25 03/02/2017   Lab Results  Component Value Date   ALT 49 02/28/2017   AST 36 02/28/2017   ALKPHOS 68 02/28/2017   BILITOT 1.2 02/28/2017     RADIOGRAPHY: No results found.    IMPRESSION/PLAN: 1. 57 y.o. gentleman with a low risk, Stage T1c adenocarcinoma of the prostate with Gleason Score of 3+3, and PSA of 6.94. This morning we reviewed the findings and workup thus far.  We discussed the natural history of prostate cancer.  We reviewed the the implications of T-stage, Gleason's Score, and PSA on decision-making and outcomes related to prostate cancer.  We discussed radiation treatment in the management of prostate cancer with regard to the logistics and delivery of external beam radiation treatment as well as the logistics and delivery of prostate brachytherapy.  We compared and contrasted each of these approaches and also compared these against prostatectomy.  The patient focused most of his questions and interest in robotic-assisted laparoscopic radical prostatectomy.  We discussed some of the potential advantages of surgery including surgical staging, the availability of salvage radiotherapy to the prostatic fossa, and the confidence associated with immediate biochemical response. We discussed some of the potential proven  indications for postoperative radiotherapy including positive margins, extracapsular extension, and seminal vesicle involvement. We also talked about some of the other potential findings leading to a recommendation for radiotherapy including a non-zero postoperative PSA and positive lymph nodes. He is leaning towards prostatectomy and will meet with Dr. Alinda Money in a few weeks. We will look forward to following his progress.    I spent 60 minutes minutes face to face with the patient and more than 50% of that time was spent in counseling and/or coordination of care.     Carola Rhine, PAC    Tyler Pita, MD  [facility] Direct Dial: [phone]  Fax: [phone] [email or web address]  Skype  LinkedIn    Page Me      This document serves as a record of services personally performed by Tyler Pita, MD and Shona Simpson, PA-C. It was created on their behalf by Bethann Humble, a trained medical scribe. The creation of this record is based on the scribe's personal observations and the provider's statements to them. This document has been checked and approved by the attending provider.

## 2017-09-08 ENCOUNTER — Ambulatory Visit
Admission: RE | Admit: 2017-09-08 | Discharge: 2017-09-08 | Disposition: A | Discharge status: Home / Self Care | Payer: Commercial Managed Care - PPO | Source: Ambulatory Visit | Attending: Urology | Admitting: Urology

## 2017-09-08 DIAGNOSIS — C61 Malignant neoplasm of prostate: Secondary | ICD-10-CM

## 2017-09-08 MED ORDER — GADOBENATE DIMEGLUMINE 529 MG/ML IV SOLN
18.0000 mL | Freq: Once | INTRAVENOUS | Status: AC | PRN
  Administered 2017-09-08: 18 mL via INTRAVENOUS

## 2017-09-21 ENCOUNTER — Other Ambulatory Visit: Payer: Self-pay | Admitting: Urology

## 2017-09-21 NOTE — Progress Notes (Signed)
Antonio Murphy is really interested in robotic prostatectomy. He has an appointment with Dr. Alinda Money to discuss surgery. He is encouraged to call with questions or concerns.

## 2017-09-22 ENCOUNTER — Encounter (HOSPITAL_COMMUNITY): Payer: Self-pay

## 2017-09-22 ENCOUNTER — Encounter (HOSPITAL_COMMUNITY)
Admission: RE | Admit: 2017-09-22 | Discharge: 2017-09-22 | Disposition: A | Discharge status: Home / Self Care | Payer: Commercial Managed Care - PPO | Source: Ambulatory Visit | Attending: Urology | Admitting: Urology

## 2017-09-22 ENCOUNTER — Other Ambulatory Visit: Payer: Self-pay

## 2017-09-22 DIAGNOSIS — Z01810 Encounter for preprocedural cardiovascular examination: Secondary | ICD-10-CM | POA: Insufficient documentation

## 2017-09-22 DIAGNOSIS — C61 Malignant neoplasm of prostate: Secondary | ICD-10-CM | POA: Diagnosis not present

## 2017-09-22 DIAGNOSIS — Z01812 Encounter for preprocedural laboratory examination: Secondary | ICD-10-CM | POA: Insufficient documentation

## 2017-09-22 HISTORY — DX: Personal history of other venous thrombosis and embolism: Z86.718

## 2017-09-22 HISTORY — DX: Personal history of other specified conditions: Z87.898

## 2017-09-22 HISTORY — DX: Personal history of other diseases of the digestive system: Z87.19

## 2017-09-22 HISTORY — DX: Personal history of other benign neoplasm: Z86.018

## 2017-09-22 HISTORY — DX: Presence of spectacles and contact lenses: Z97.3

## 2017-09-22 HISTORY — DX: Presence of dental prosthetic device (complete) (partial): Z97.2

## 2017-09-22 LAB — BASIC METABOLIC PANEL
Anion gap: 7 (ref 5–15)
BUN: 15 mg/dL (ref 6–20)
CALCIUM: 9.8 mg/dL (ref 8.9–10.3)
CO2: 28 mmol/L (ref 22–32)
Chloride: 108 mmol/L (ref 101–111)
Creatinine, Ser: 1.06 mg/dL (ref 0.61–1.24)
GFR calc Af Amer: >60 mL/min (ref >60)
GLUCOSE: 113 mg/dL — AB (ref 65–99)
Potassium: 4.2 mmol/L (ref 3.5–5.1)
SODIUM: 143 mmol/L (ref 135–145)

## 2017-09-22 LAB — CBC
HCT: 43.2 % (ref 39.0–52.0)
Hemoglobin: 14.8 g/dL (ref 13.0–17.0)
MCH: 31.8 pg (ref 26.0–34.0)
MCHC: 34.3 g/dL (ref 30.0–36.0)
MCV: 92.9 fL (ref 78.0–100.0)
PLATELETS: 341 10*3/uL (ref 150–400)
RBC: 4.65 MIL/uL (ref 4.22–5.81)
RDW: 13.6 % (ref 11.5–15.5)
WBC: 6.6 10*3/uL (ref 4.0–10.5)

## 2017-09-22 LAB — ABO/RH: ABO/RH(D): A POS

## 2017-09-22 NOTE — Patient Instructions (Addendum)
Antonio Murphy  09/22/2017   Your procedure is scheduled on:    09-27-2017  Report to [facility] Main  Entrance  Report to admitting at   5:30 AM    Call this number if you have problems the morning of surgery [phone]   Remember: DO MAGNESIUM CITRATE (8oz) AT [place] not eat food or drink liquids :After Midnight.     Take these medicines the morning of surgery with A SIP OF WATER:   FLOMAX                                You may not have any metal on your body including hair pins and              piercings  Do not wear jewelry, make-up, lotions, powders or perfumes, deodorant                      Men may shave face and neck.   Do not bring valuables to the hospital. [place].  Contacts, dentures or bridgework may not be worn into surgery.  Leave suitcase in the car. After surgery it may be brought to your room.   Special Instructions:  DO FLEET ENEMA NIGHT BEFORE SURGERY                                                              Please read over the following fact sheets you were given: _____________________________________________________________________    Incentive Spirometer  An incentive spirometer is a tool that can help keep your lungs clear and active. This tool measures how well you are filling your lungs with each breath. Taking long deep breaths may help reverse or decrease the chance of developing breathing (pulmonary) problems (especially infection) following:  A long period of time when you are unable to move or be active. BEFORE THE PROCEDURE   If the spirometer includes an indicator to show your best effort, your nurse or respiratory therapist will set it to a desired goal.  If possible, sit up straight or lean slightly forward. Try not to slouch.  Hold the incentive spirometer in an upright position. INSTRUCTIONS FOR USE   1. Sit on the edge of your bed if possible, or sit up as far as you can in bed or on a chair. 2. Hold the incentive spirometer in an upright position. 3. Breathe out normally. 4. Place the mouthpiece in your mouth and seal your lips tightly around it. 5. Breathe in slowly and as deeply as possible, raising the piston or the ball toward the top of the column. 6. Hold your breath for 3-5 seconds or for as long as possible. Allow the piston or ball to fall to the bottom of the column. 7. Remove the mouthpiece from  your mouth and breathe out normally. 8. Rest for a few seconds and repeat Steps 1 through 7 at least 10 times every 1-2 hours when you are awake. Take your time and take a few normal breaths between deep breaths. 9. The spirometer may include an indicator to show your best effort. Use the indicator as a goal to work toward during each repetition. 10. After each set of 10 deep breaths, practice coughing to be sure your lungs are clear. If you have an incision (the cut made at the time of surgery), support your incision when coughing by placing a pillow or rolled up towels firmly against it. Once you are able to get out of bed, walk around indoors and cough well. You may stop using the incentive spirometer when instructed by your caregiver.  RISKS AND COMPLICATIONS  Take your time so you do not get dizzy or light-headed.  If you are in pain, you may need to take or ask for pain medication before doing incentive spirometry. It is harder to take a deep breath if you are having pain. AFTER USE  Rest and breathe slowly and easily.  It can be helpful to keep track of a log of your progress. Your caregiver can provide you with a simple table to help with this. If you are using the spirometer at home, follow these instructions: [place] IF:   You are having difficultly using the spirometer.  You have trouble using the spirometer as often as instructed.  Your pain medication is  not giving enough relief while using the spirometer.  You develop fever of 100.5 F (38.1 C) or higher. SEEK IMMEDIATE MEDICAL CARE IF:   You cough up bloody sputum that had not been present before.  You develop fever of 102 F (38.9 C) or greater.  You develop worsening pain at or near the incision site. MAKE SURE YOU:   Understand these instructions.  Will watch your condition.  Will get help right away if you are not doing well or get worse. Document Released: 08/17/2006 Document Revised: 06/29/2011 Document Reviewed: 10/18/2006 ExitCare Patient Information 2014 ExitCare, [place].   ________________________________________________________________________  WHAT IS A BLOOD TRANSFUSION? Blood Transfusion Information  A transfusion is the replacement of blood or some of its parts. Blood is made up of multiple cells which provide different functions.  Red blood cells carry oxygen and are used for blood loss replacement.  White blood cells fight against infection.  Platelets control bleeding.  Plasma helps clot blood.  Other blood products are available for specialized needs, such as hemophilia or other clotting disorders. BEFORE THE TRANSFUSION  Who gives blood for transfusions?   Healthy volunteers who are fully evaluated to make sure their blood is safe. This is blood bank blood. Transfusion therapy is the safest it has ever been in the practice of medicine. Before blood is taken from a donor, a complete history is taken to make sure that person has no history of diseases nor engages in risky social behavior (examples are intravenous drug use or sexual activity with multiple partners). The donor's travel history is screened to minimize risk of transmitting infections, such as malaria. The donated blood is tested for signs of infectious diseases, such as HIV and hepatitis. The blood is then tested to be sure it is compatible with you in order to minimize the chance of a  transfusion reaction. If you or a relative donates blood, this is often done in anticipation of surgery and is  not appropriate for emergency situations. It takes many days to process the donated blood. RISKS AND COMPLICATIONS Although transfusion therapy is very safe and saves many lives, the main dangers of transfusion include:   Getting an infectious disease.  Developing a transfusion reaction. This is an allergic reaction to something in the blood you were given. Every precaution is taken to prevent this. The decision to have a blood transfusion has been considered carefully by your caregiver before blood is given. Blood is not given unless the benefits outweigh the risks. AFTER THE TRANSFUSION  Right after receiving a blood transfusion, you will usually feel much better and more energetic. This is especially true if your red blood cells have gotten low (anemic). The transfusion raises the level of the red blood cells which carry oxygen, and this usually causes an energy increase.  The nurse administering the transfusion will monitor you carefully for complications. HOME CARE INSTRUCTIONS  No special instructions are needed after a transfusion. You may find your energy is better. Speak with your caregiver about any limitations on activity for underlying diseases you may have. SEEK MEDICAL CARE IF:   Your condition is not improving after your transfusion.  You develop redness or irritation at the intravenous (IV) site. SEEK IMMEDIATE MEDICAL CARE IF:  Any of the following symptoms occur over the next 12 hours:  Shaking chills.  You have a temperature by mouth above 102 F (38.9 C), not controlled by medicine.  Chest, back, or muscle pain.  People around you feel you are not acting correctly or are confused.  Shortness of breath or difficulty breathing.  Dizziness and fainting.  You get a rash or develop hives.  You have a decrease in urine output.  Your urine turns a dark  color or changes to pink, red, or brown. Any of the following symptoms occur over the next 10 days:  You have a temperature by mouth above 102 F (38.9 C), not controlled by medicine.  Shortness of breath.  Weakness after normal activity.  The white part of the eye turns yellow (jaundice).  You have a decrease in the amount of urine or are urinating less often.  Your urine turns a dark color or changes to pink, red, or brown. Document Released: 04/03/2000 Document Revised: 06/29/2011 Document Reviewed: 11/21/2007 ExitCare Patient Information 2014 ExitCare, [place]    ____[facility] - Preparing for Surgery Before surgery, you can play an important role.  Because skin is not sterile, your skin needs to be as free of germs as possible.  You can reduce the number of germs on your skin by washing with CHG (chlorahexidine gluconate) soap before surgery.  CHG is an antiseptic cleaner which kills germs and bonds with the skin to continue killing germs even after washing. Please DO NOT use if you have an allergy to CHG or antibacterial soaps.  If your skin becomes reddened/irritated stop using the CHG and inform your nurse when you arrive at Short Stay. Do not shave (including legs and underarms) for at least 48 hours prior to the first CHG shower.  You may shave your face/neck. Please follow these instructions carefully:  1.  Shower with CHG Soap the night before surgery and the  morning of Surgery.  2.  If you choose to wash your hair, wash your hair first as usual with your  normal  shampoo.  3.  After you shampoo, rinse your hair and body thoroughly to remove the  shampoo.  4.  Use CHG as you would any other liquid soap.  You can apply chg directly  to the skin and wash                       Gently with a scrungie or clean washcloth.  5.  Apply the CHG Soap to your body ONLY FROM THE NECK DOWN.   Do not use on face/ open                           Wound or open sores.  Avoid contact with eyes, ears mouth and genitals (private parts).                       Wash face,  Genitals (private parts) with your normal soap.             6.  Wash thoroughly, paying special attention to the area where your surgery  will be performed.  7.  Thoroughly rinse your body with warm water from the neck down.  8.  DO NOT shower/wash with your normal soap after using and rinsing off  the CHG Soap.                9.  Pat yourself dry with a clean towel.            10.  Wear clean pajamas.            11.  Place clean sheets on your bed the night of your first shower and do not  sleep with pets. Day of Surgery : Do not apply any lotions/deodorants the morning of surgery.  Please wear clean clothes to the hospital/surgery center.  FAILURE TO FOLLOW THESE INSTRUCTIONS MAY RESULT IN THE CANCELLATION OF YOUR SURGERY PATIENT SIGNATURE_________________________________  NURSE SIGNATURE__________________________________  ________________________________________________________________________

## 2017-09-24 ENCOUNTER — Encounter: Payer: Self-pay | Admitting: Urology

## 2017-09-24 NOTE — Progress Notes (Signed)
Patient met with Dr. Alinda Money on 09/21/2017 and has decided to proceed with radical prostatectomy for treatment of his prostate cancer.  His procedure is scheduled for 09/27/2017.

## 2017-09-24 NOTE — Progress Notes (Signed)
Final ekg dated 09-22-2017 in epic.

## 2017-09-24 NOTE — H&P (Signed)
Office Visit Report     09/21/2017   --------------------------------------------------------------------------------   Antonio Murphy  MRN: [id number]  PRIMARY CARE:    DOB: Dec 03, 1960, 57 year old Male  REFERRING:    SSN: [id number]  PROVIDER:  Ellison Murphy, M.D.    TREATING:  Antonio Murphy, M.D.    LOCATION:  [facility] [phone]   --------------------------------------------------------------------------------   CC/HPI: CC: Prostate Cancer   Physician requesting consult: Dr. Aleen Murphy  PCP:   Antonio Murphy is a 57 year old gentleman who moved to [place] from [place] last year. He was initially seen by Antonio Murphy as a hospital consult when he presented with acute urinary retention due to acute prostatitis (Enterobacter aerogenes). He underwent a voiding trial and has since been voiding with tamsulosin. IPSS is 4. He apparently had a history of a possible prostate infection in the summer of 2015 as well although did not develop retention at that time. He was noted to have a persistent elevation of his PSA following resolution of his retention at 6.94. He underwent a TRUS biopsy of the prostate on 08/02/17 that confirmed Gleason 3+3=6 adenocarcinoma of the prostate with 3 out of 12 biopsy cores positive for malignancy. He is well informed about his treatment options from his prior discussions with Antonio Murphy and also with Antonio Murphy with Radiation Oncology.   Family history: None.   Imaging studies: MRI (09/08/17): 1.8 cm PI RADS 3 lesion at right mid gland, 1.4 cm PI RADS 3 lesion at right apex, No SVI, EPE or LAD.   PMH: He has a history of DVT and hypertension. His DVT occurred after a long period of travel with the mobilization. He has never had other DVTs. He apparently did undergo testing and had no genetic predisposition to DVT/PE.  PSH: He has a history of a partial colectomy and splenectomy (his pathology report has confirm that this with a benign  lesion). This was performed in the summer of 2014. He developed a small bowel obstruction and required laparoscopic lysis of adhesions in March 2015. 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 He has an upper midline scar extending from his xiphoid to just below his umbilicus.   TNM stage: cT1c N0 Mx  PSA: 6.94  Gleason score: 3+3=6  Biopsy (08/02/17): 3/12 cores positive  Left: Benign  Right: R mid (5%, 3+3=6), R base (20%, 3+3=6), R lateral base (10%, 3+3=6)  Prostate volume: 32.2 cc  PSAD: 0.22   Nomogram  OC disease: 60%  EPE: 39%  SVI: 1%  LNI: 1%  PFS (5 year, 10 year): 93%, 87%   Urinary function: IPSS is 4. He takes tamsulosin and has a history of retention due to prostatitis.  Erectile function: SHIM score is 20. He does take PDE-5 inhibitors prn but does not require them regularly.     ALLERGIES: None   MEDICATIONS: Tamsulosin Hcl 0.4 mg capsule 1 capsule PO Daily  Amlodipine Besylate-Benazepril 5 mg-20 mg capsule     GU PSH: Prostate Needle Biopsy - 08/02/2017      PSH Notes: Lysis of Adhesions: 2014   NON-GU PSH: Partial colectomy - 2014 Splenectomy - 2014 Surgical Pathology, Gross And Microscopic Examination For Prostate Needle - 08/02/2017    GU PMH: Prostate Cancer - 08/09/2017 Elevated PSA - 07/15/2017 BPH w/LUTS - 03/04/2017 Nocturia - 03/04/2017 Urinary Retention - 03/04/2017    NON-GU PMH: Peritoneal adhesions (postprocedural) (postinfection) - 08/09/2017 DVT, History  Hypertension    FAMILY HISTORY: 1 Daughter - Daughter 1 son - Son Death In The Family Father - Father Death In The Family Mother - Mother Lung Cancer - Mother pneumonia - Father   SOCIAL HISTORY: Marital Status: Married Preferred Language: English; Ethnicity: Not Hispanic Or Latino; Race: White Current Smoking Status: Patient has never smoked.   Tobacco Use Assessment Completed: Used Tobacco in last 30 days? Drinks 2 drinks per year.   Drinks 3 caffeinated drinks per day. Patient's occupation Youth worker.    REVIEW OF SYSTEMS:    GU Review Male:   Patient denies frequent urination, hard to postpone urination, burning/ pain with urination, get up at night to urinate, leakage of urine, stream starts and stops, trouble starting your streams, and have to strain to urinate .  Gastrointestinal (Lower):   Patient denies diarrhea and constipation.  Gastrointestinal (Upper):   Patient denies vomiting and nausea.  Constitutional:   Patient denies fever, night sweats, weight loss, and fatigue.  Skin:   Patient denies skin rash/ lesion and itching.  Eyes:   Patient denies blurred vision and double vision.  Ears/ Nose/ Throat:   Patient denies sore throat and sinus problems.  Hematologic/Lymphatic:   Patient denies swollen glands and easy bruising.  Cardiovascular:   Patient denies leg swelling and chest pains.  Respiratory:   Patient denies cough and shortness of breath.  Endocrine:   Patient denies excessive thirst.  Musculoskeletal:   Patient denies back pain and joint pain.  Neurological:   Patient denies headaches and dizziness.  Psychologic:   Patient denies depression and anxiety.   VITAL SIGNS:      09/21/2017 08:26 AM  Weight 190 lb / 86.18 kg  Height 73 in / 185.42 cm  BP 127/83 mmHg  Pulse 58 /min  BMI 25.1 kg/m   GU PHYSICAL EXAMINATION:    Prostate: Prostate about 40 grams. Left lobe normal consistency, right lobe normal consistency. Symmetrical lobes. No prostate nodule. Left lobe no tenderness, right lobe no tenderness.    MULTI-SYSTEM PHYSICAL EXAMINATION:    Constitutional: Well-nourished. No physical deformities. Normally developed. Good grooming.  Neck: Neck symmetrical, not swollen. Normal tracheal position.  Respiratory: No labored breathing, no use of accessory muscles. Normal breath sounds. Clear bilaterally.  Cardiovascular: Regular rate and rhythm. No murmur, no gallop. Normal temperature,  normal extremity pulses, no swelling, no varicosities.  Lymphatic: No enlargement of neck, axillae, groin.  Skin: No paleness, no jaundice, no cyanosis. No lesion, no ulcer, no rash.  Neurologic / Psychiatric: Oriented to time, oriented to place, oriented to person. No depression, no anxiety, no agitation.  Gastrointestinal: No mass, no tenderness, no rigidity, non obese abdomen.  Eyes: Normal conjunctivae. Normal eyelids.  Ears, Nose, Mouth, and Throat: Left ear no scars, no lesions, no masses. Right ear no scars, no lesions, no masses. Nose no scars, no lesions, no masses. Normal hearing. Normal lips.  Musculoskeletal: Normal gait and station of head and neck.     PAST DATA REVIEWED:  Source Of History:  Patient  Lab Test Review:   PSA  Records Review:   Pathology Reports, Previous Patient Records  Urine Test Review:   Urinalysis  X-Ray Review: MRI Prostate GSORAD: Reviewed Films.     07/15/17 06/17/17  PSA  Total PSA 6.94 ng/mL 4.87 ng/mL  Free PSA 0.50 ng/mL   % Free PSA 7 % PSA     PROCEDURES:          Urinalysis Dipstick  Dipstick Cont'd  Color: Straw Bilirubin: Neg  Appearance: Clear Ketones: Neg  Specific Gravity: 1.010 Blood: Neg  pH: 7.5 Protein: Neg  Glucose: Neg Urobilinogen: 0.2    Nitrites: Neg    Leukocyte Esterase: Neg    ASSESSMENT:      ICD-10 Details  1 GU:   Prostate Cancer - C61    PLAN:           Schedule Return Visit/Planned Activity: Other See Visit Notes             Note: Will call to schedule surgery  Return Visit/Planned Activity: Next Available Appointment - PT/OT Referral             Note: Please schedule patient for preoperative PT prior to radical prostatectomy.            Document Letter(s):  Created for Patient: Clinical Summary         Notes:   1. Prostate cancer: I had a long discussion with Mr. Geoghegan and his wife today regarding his prostate cancer situation in the context of his prior surgical history and medical history. We  specifically reviewed the options of active surveillance versus definitive therapy. He is very well informed and is most interested in proceeding with surgical treatment.   The patient was counseled about the natural history of prostate cancer and the standard treatment options that are available for prostate cancer. It was explained to him how his age and life expectancy, clinical stage, Gleason score, and PSA affect his prognosis, the decision to proceed with additional staging studies, as well as how that information influences recommended treatment strategies. We discussed the roles for active surveillance, radiation therapy, surgical therapy, androgen deprivation, as well as ablative therapy options for the treatment of prostate cancer as appropriate to his individual cancer situation. We discussed the risks and benefits of these options with regard to their impact on cancer control and also in terms of potential adverse events, complications, and impact on quality of life particularly related to urinary and sexual function. The patient was encouraged to ask questions throughout the discussion today and all questions were answered to his stated satisfaction. In addition, the patient was provided with and/or directed to appropriate resources and literature for further education about prostate cancer and treatment options.   We discussed surgical therapy for prostate cancer including the different available surgical approaches. We discussed, in detail, the risks and expectations of surgery with regard to cancer control, urinary control, and erectile function as well as the expected postoperative recovery process. Additional risks of surgery including but not limited to bleeding, infection, hernia formation, nerve damage, lymphocele formation, bowel/rectal injury potentially necessitating colostomy, damage to the urinary tract resulting in urine leakage, urethral stricture, and the cardiopulmonary risks such  as myocardial infarction, stroke, death, venothromboembolism, etc. were explained. The risk of open surgical conversion for robotic/laparoscopic prostatectomy was also discussed.   He is adamant that he does wish to proceed with surgical treatment after discussion today. He will be scheduled for a bilateral nerve-sparing robot assisted laparoscopic radical prostatectomy. He understands the increased risk for possible open surgical conversion and the potential need for adhesiolysis.   CC: Dr. Harrell Gave Winter  Dr. Tyler Pita         Next Appointment:      Next Appointment: 09/22/2017 03:00 PM    Appointment Type: 12 Minute PT Pre-Op    Location: [facility] [phone]    Provider: Doran Durand  Reason for Visit: PRE OP RADICAL PROSTATECTOMY      E & M CODE: I spent at least 68 minutes face to face with the patient, more than 50% of that time was spent on counseling and/or coordinating care.     * Signed by Antonio Murphy, M.D. on 09/21/17 at 9:58 PM (EDT)*

## 2017-09-27 ENCOUNTER — Other Ambulatory Visit: Payer: Self-pay

## 2017-09-27 ENCOUNTER — Ambulatory Visit (HOSPITAL_COMMUNITY): Payer: Commercial Managed Care - PPO | Admitting: Certified Registered Nurse Anesthetist

## 2017-09-27 ENCOUNTER — Encounter (HOSPITAL_COMMUNITY): Payer: Self-pay | Admitting: *Deleted

## 2017-09-27 ENCOUNTER — Observation Stay (HOSPITAL_COMMUNITY)
Admission: RE | Admit: 2017-09-27 | Discharge: 2017-09-28 | Disposition: A | Discharge status: Home / Self Care | Payer: Commercial Managed Care - PPO | Source: Ambulatory Visit | Attending: Urology | Admitting: Urology

## 2017-09-27 ENCOUNTER — Encounter (HOSPITAL_COMMUNITY)
Admission: RE | Disposition: A | Discharge status: Home / Self Care | Payer: Self-pay | Source: Ambulatory Visit | Attending: Urology

## 2017-09-27 DIAGNOSIS — Z9049 Acquired absence of other specified parts of digestive tract: Secondary | ICD-10-CM | POA: Diagnosis not present

## 2017-09-27 DIAGNOSIS — C61 Malignant neoplasm of prostate: Principal | ICD-10-CM | POA: Diagnosis present

## 2017-09-27 DIAGNOSIS — Z9081 Acquired absence of spleen: Secondary | ICD-10-CM | POA: Diagnosis not present

## 2017-09-27 DIAGNOSIS — I10 Essential (primary) hypertension: Secondary | ICD-10-CM | POA: Insufficient documentation

## 2017-09-27 DIAGNOSIS — Z79899 Other long term (current) drug therapy: Secondary | ICD-10-CM | POA: Diagnosis not present

## 2017-09-27 HISTORY — PX: ROBOT ASSISTED LAPAROSCOPIC RADICAL PROSTATECTOMY: SHX5141

## 2017-09-27 LAB — HEMOGLOBIN AND HEMATOCRIT, BLOOD
HEMATOCRIT: 39.9 % (ref 39.0–52.0)
HEMOGLOBIN: 13.6 g/dL (ref 13.0–17.0)

## 2017-09-27 LAB — TYPE AND SCREEN
ABO/RH(D): A POS
ANTIBODY SCREEN: NEGATIVE

## 2017-09-27 SURGERY — XI ROBOTIC ASSISTED LAPAROSCOPIC RADICAL PROSTATECTOMY LEVEL 3
Anesthesia: General

## 2017-09-27 MED ORDER — OXYCODONE HCL 5 MG PO CAPS: 5.0000 mg | ORAL_CAPSULE | ORAL | 0 refills | Status: DC | PRN

## 2017-09-27 MED ORDER — SODIUM CHLORIDE 0.9 % IR SOLN
Status: DC | PRN
  Administered 2017-09-27: 1000 mL via INTRAVESICAL

## 2017-09-27 MED ORDER — LIDOCAINE 2% (20 MG/ML) 5 ML SYRINGE
INTRAMUSCULAR | Status: AC
  Filled 2017-09-27: qty 5

## 2017-09-27 MED ORDER — DIPHENHYDRAMINE HCL 50 MG/ML IJ SOLN: 12.5000 mg | Freq: Four times a day (QID) | INTRAMUSCULAR | Status: DC | PRN

## 2017-09-27 MED ORDER — DEXAMETHASONE SODIUM PHOSPHATE 10 MG/ML IJ SOLN
INTRAMUSCULAR | Status: DC | PRN
  Administered 2017-09-27: 10 mg via INTRAVENOUS

## 2017-09-27 MED ORDER — FENTANYL CITRATE (PF) 250 MCG/5ML IJ SOLN
INTRAMUSCULAR | Status: AC
  Filled 2017-09-27: qty 5

## 2017-09-27 MED ORDER — PROPOFOL 10 MG/ML IV BOLUS
INTRAVENOUS | Status: AC
  Filled 2017-09-27: qty 20

## 2017-09-27 MED ORDER — MAGNESIUM CITRATE PO SOLN
1.0000 | Freq: Once | ORAL | Status: DC
  Administered 2017-09-27: 1 via ORAL

## 2017-09-27 MED ORDER — ROCURONIUM BROMIDE 10 MG/ML (PF) SYRINGE
PREFILLED_SYRINGE | INTRAVENOUS | Status: AC
  Filled 2017-09-27: qty 5

## 2017-09-27 MED ORDER — EPHEDRINE SULFATE 50 MG/ML IJ SOLN
INTRAMUSCULAR | Status: DC | PRN
  Administered 2017-09-27 (×3): 5 mg via INTRAVENOUS

## 2017-09-27 MED ORDER — HYDROMORPHONE HCL 1 MG/ML IJ SOLN: 0.2500 mg | INTRAMUSCULAR | Status: DC | PRN

## 2017-09-27 MED ORDER — LACTATED RINGERS IV SOLN
INTRAVENOUS | Status: DC | PRN
  Administered 2017-09-27: 08:00:00 via INTRAVENOUS

## 2017-09-27 MED ORDER — DOCUSATE SODIUM 100 MG PO CAPS
100.0000 mg | ORAL_CAPSULE | Freq: Two times a day (BID) | ORAL | Status: DC
  Administered 2017-09-27 – 2017-09-28 (×2): 100 mg via ORAL
  Filled 2017-09-27 (×2): qty 1

## 2017-09-27 MED ORDER — ONDANSETRON HCL 4 MG/2ML IJ SOLN
INTRAMUSCULAR | Status: DC | PRN
  Administered 2017-09-27: 4 mg via INTRAVENOUS

## 2017-09-27 MED ORDER — OXYCODONE HCL 5 MG PO TABS: 5.0000 mg | ORAL_TABLET | Freq: Once | ORAL | Status: DC | PRN

## 2017-09-27 MED ORDER — KETOROLAC TROMETHAMINE 15 MG/ML IJ SOLN
15.0000 mg | Freq: Four times a day (QID) | INTRAMUSCULAR | Status: DC
  Administered 2017-09-27 – 2017-09-28 (×5): 15 mg via INTRAVENOUS
  Filled 2017-09-27 (×5): qty 1

## 2017-09-27 MED ORDER — HEPARIN SODIUM (PORCINE) 1000 UNIT/ML IJ SOLN
INTRAMUSCULAR | Status: DC | PRN
  Administered 2017-09-27: 1000 mL

## 2017-09-27 MED ORDER — MIDAZOLAM HCL 2 MG/2ML IJ SOLN
INTRAMUSCULAR | Status: AC
  Filled 2017-09-27: qty 2

## 2017-09-27 MED ORDER — MIDAZOLAM HCL 5 MG/5ML IJ SOLN
INTRAMUSCULAR | Status: DC | PRN
  Administered 2017-09-27: 2 mg via INTRAVENOUS

## 2017-09-27 MED ORDER — HYDROMORPHONE HCL 2 MG/ML IJ SOLN
INTRAMUSCULAR | Status: AC
  Filled 2017-09-27: qty 1

## 2017-09-27 MED ORDER — PROMETHAZINE HCL 25 MG/ML IJ SOLN: 6.2500 mg | INTRAMUSCULAR | Status: DC | PRN

## 2017-09-27 MED ORDER — GLYCOPYRROLATE 0.2 MG/ML IJ SOLN
INTRAMUSCULAR | Status: DC | PRN
  Administered 2017-09-27 (×2): 0.2 mg via INTRAVENOUS

## 2017-09-27 MED ORDER — DIPHENHYDRAMINE HCL 12.5 MG/5ML PO ELIX: 12.5000 mg | ORAL_SOLUTION | Freq: Four times a day (QID) | ORAL | Status: DC | PRN

## 2017-09-27 MED ORDER — LIDOCAINE 2% (20 MG/ML) 5 ML SYRINGE
INTRAMUSCULAR | Status: DC | PRN
  Administered 2017-09-27: 100 mg via INTRAVENOUS

## 2017-09-27 MED ORDER — LACTATED RINGERS IV SOLN
INTRAVENOUS | Status: DC
  Administered 2017-09-27 (×2): via INTRAVENOUS

## 2017-09-27 MED ORDER — FENTANYL CITRATE (PF) 100 MCG/2ML IJ SOLN
INTRAMUSCULAR | Status: DC | PRN
  Administered 2017-09-27: 50 ug via INTRAVENOUS
  Administered 2017-09-27: 25 ug via INTRAVENOUS
  Administered 2017-09-27: 50 ug via INTRAVENOUS
  Administered 2017-09-27: 25 ug via INTRAVENOUS

## 2017-09-27 MED ORDER — MEPERIDINE HCL 50 MG/ML IJ SOLN: 6.2500 mg | INTRAMUSCULAR | Status: DC | PRN

## 2017-09-27 MED ORDER — CEFAZOLIN SODIUM-DEXTROSE 1-4 GM/50ML-% IV SOLN
1.0000 g | Freq: Three times a day (TID) | INTRAVENOUS | Status: AC
  Administered 2017-09-27 (×2): 1 g via INTRAVENOUS
  Filled 2017-09-27 (×2): qty 50

## 2017-09-27 MED ORDER — BUPIVACAINE-EPINEPHRINE (PF) 0.25% -1:200000 IJ SOLN
INTRAMUSCULAR | Status: AC
  Filled 2017-09-27: qty 30

## 2017-09-27 MED ORDER — DEXAMETHASONE SODIUM PHOSPHATE 10 MG/ML IJ SOLN
INTRAMUSCULAR | Status: AC
  Filled 2017-09-27: qty 1

## 2017-09-27 MED ORDER — OXYCODONE HCL 5 MG/5ML PO SOLN
5.0000 mg | Freq: Once | ORAL | Status: DC | PRN
  Filled 2017-09-27: qty 5

## 2017-09-27 MED ORDER — PROPOFOL 10 MG/ML IV BOLUS
INTRAVENOUS | Status: DC | PRN
  Administered 2017-09-27: 160 mg via INTRAVENOUS

## 2017-09-27 MED ORDER — ONDANSETRON HCL 4 MG/2ML IJ SOLN
INTRAMUSCULAR | Status: AC
  Filled 2017-09-27: qty 2

## 2017-09-27 MED ORDER — HYDROMORPHONE HCL 1 MG/ML IJ SOLN
INTRAMUSCULAR | Status: DC | PRN
  Administered 2017-09-27 (×2): .4 mg via INTRAVENOUS

## 2017-09-27 MED ORDER — ACETAMINOPHEN 325 MG PO TABS: 650.0000 mg | ORAL_TABLET | ORAL | Status: DC | PRN

## 2017-09-27 MED ORDER — ROCURONIUM BROMIDE 50 MG/5ML IV SOSY
PREFILLED_SYRINGE | INTRAVENOUS | Status: DC | PRN
  Administered 2017-09-27: 10 mg via INTRAVENOUS
  Administered 2017-09-27: 20 mg via INTRAVENOUS
  Administered 2017-09-27: 10 mg via INTRAVENOUS
  Administered 2017-09-27: 20 mg via INTRAVENOUS
  Administered 2017-09-27: 70 mg via INTRAVENOUS

## 2017-09-27 MED ORDER — MORPHINE SULFATE (PF) 2 MG/ML IV SOLN: 2.0000 mg | INTRAVENOUS | Status: DC | PRN

## 2017-09-27 MED ORDER — HEPARIN SODIUM (PORCINE) 1000 UNIT/ML IJ SOLN
INTRAMUSCULAR | Status: AC
  Filled 2017-09-27: qty 1

## 2017-09-27 MED ORDER — BUPIVACAINE-EPINEPHRINE 0.25% -1:200000 IJ SOLN
INTRAMUSCULAR | Status: DC | PRN
  Administered 2017-09-27: 30 mL

## 2017-09-27 MED ORDER — EPHEDRINE 5 MG/ML INJ
INTRAVENOUS | Status: AC
  Filled 2017-09-27: qty 10

## 2017-09-27 MED ORDER — SODIUM CHLORIDE 0.9 % IV BOLUS
1000.0000 mL | Freq: Once | INTRAVENOUS | Status: AC
  Administered 2017-09-27: 1000 mL via INTRAVENOUS

## 2017-09-27 MED ORDER — SUGAMMADEX SODIUM 500 MG/5ML IV SOLN
INTRAVENOUS | Status: DC | PRN
  Administered 2017-09-27: 300 mg via INTRAVENOUS

## 2017-09-27 MED ORDER — FLEET ENEMA 7-19 GM/118ML RE ENEM
1.0000 | ENEMA | Freq: Once | RECTAL | Status: AC
  Administered 2017-09-27: 1 via RECTAL
  Filled 2017-09-27: qty 1

## 2017-09-27 MED ORDER — CEFAZOLIN SODIUM-DEXTROSE 2-4 GM/100ML-% IV SOLN
2.0000 g | Freq: Once | INTRAVENOUS | Status: AC
  Administered 2017-09-27: 2 g via INTRAVENOUS
  Filled 2017-09-27: qty 100

## 2017-09-27 MED ORDER — KCL IN DEXTROSE-NACL 20-5-0.45 MEQ/L-%-% IV SOLN
INTRAVENOUS | Status: DC
  Administered 2017-09-27 – 2017-09-28 (×3): via INTRAVENOUS
  Filled 2017-09-27 (×4): qty 1000

## 2017-09-27 SURGICAL SUPPLY — 54 items
CATH FOLEY 2WAY SLVR 18FR 30CC (CATHETERS) ×4 IMPLANT
CATH ROBINSON RED A/P 16FR (CATHETERS) ×4 IMPLANT
CATH ROBINSON RED A/P 8FR (CATHETERS) ×4 IMPLANT
CATH TIEMANN FOLEY 18FR 5CC (CATHETERS) ×4 IMPLANT
CHLORAPREP W/TINT 26ML (MISCELLANEOUS) ×4 IMPLANT
CLIP VESOLOCK LG 6/CT PURPLE (CLIP) ×8 IMPLANT
COVER SURGICAL LIGHT HANDLE (MISCELLANEOUS) ×4 IMPLANT
COVER TIP SHEARS 8 DVNC (MISCELLANEOUS) ×2 IMPLANT
COVER TIP SHEARS 8MM DA VINCI (MISCELLANEOUS) ×2
CUTTER ECHEON FLEX ENDO 45 340 (ENDOMECHANICALS) ×4 IMPLANT
DECANTER SPIKE VIAL GLASS SM (MISCELLANEOUS) ×4 IMPLANT
DERMABOND ADVANCED (GAUZE/BANDAGES/DRESSINGS)
DERMABOND ADVANCED .7 DNX12 (GAUZE/BANDAGES/DRESSINGS) IMPLANT
DRAPE ARM DVNC X/XI (DISPOSABLE) ×8 IMPLANT
DRAPE COLUMN DVNC XI (DISPOSABLE) ×2 IMPLANT
DRAPE DA VINCI XI ARM (DISPOSABLE) ×8
DRAPE DA VINCI XI COLUMN (DISPOSABLE) ×2
DRAPE SURG IRRIG POUCH 19X23 (DRAPES) ×4 IMPLANT
DRSG TEGADERM 4X4.75 (GAUZE/BANDAGES/DRESSINGS) ×4 IMPLANT
ELECT REM PT RETURN 15FT ADLT (MISCELLANEOUS) ×4 IMPLANT
GLOVE BIO SURGEON STRL SZ 6.5 (GLOVE) ×3 IMPLANT
GLOVE BIO SURGEONS STRL SZ 6.5 (GLOVE) ×1
GLOVE BIOGEL M STRL SZ7.5 (GLOVE) ×8 IMPLANT
GOWN STRL REUS W/TWL LRG LVL3 (GOWN DISPOSABLE) ×12 IMPLANT
HOLDER FOLEY CATH W/STRAP (MISCELLANEOUS) ×4 IMPLANT
IRRIG SUCT STRYKERFLOW 2 WTIP (MISCELLANEOUS) ×4
IRRIGATION SUCT STRKRFLW 2 WTP (MISCELLANEOUS) ×2 IMPLANT
IV LACTATED RINGERS 1000ML (IV SOLUTION) ×4 IMPLANT
NDL SAFETY ECLIPSE 18X1.5 (NEEDLE) ×2 IMPLANT
NEEDLE HYPO 18GX1.5 SHARP (NEEDLE) ×2
PACK ROBOT UROLOGY CUSTOM (CUSTOM PROCEDURE TRAY) ×4 IMPLANT
PENCIL ELECTRO RS 15 CORD (MISCELLANEOUS) ×4 IMPLANT
SCISSORS LAP 5X35 DISP (ENDOMECHANICALS) ×4 IMPLANT
SEAL CANN UNIV 5-8 DVNC XI (MISCELLANEOUS) ×8 IMPLANT
SEAL XI 5MM-8MM UNIVERSAL (MISCELLANEOUS) ×8
SOLUTION ELECTROLUBE (MISCELLANEOUS) ×4 IMPLANT
STAPLE RELOAD 45 GRN (STAPLE) ×2 IMPLANT
STAPLE RELOAD 45MM GREEN (STAPLE) ×2
SUT ETHILON 3 0 PS 1 (SUTURE) ×4 IMPLANT
SUT MNCRL 3 0 RB1 (SUTURE) ×4 IMPLANT
SUT MNCRL 3 0 VIOLET RB1 (SUTURE) ×2 IMPLANT
SUT MNCRL AB 4-0 PS2 18 (SUTURE) ×8 IMPLANT
SUT MONOCRYL 3 0 RB1 (SUTURE) ×6
SUT VIC AB 0 CT1 27 (SUTURE) ×2
SUT VIC AB 0 CT1 27XBRD ANTBC (SUTURE) ×2 IMPLANT
SUT VIC AB 0 UR5 27 (SUTURE) ×4 IMPLANT
SUT VIC AB 2-0 SH 27 (SUTURE) ×2
SUT VIC AB 2-0 SH 27X BRD (SUTURE) ×2 IMPLANT
SUT VICRYL 0 UR6 27IN ABS (SUTURE) ×8 IMPLANT
SYR 27GX1/2 1ML LL SAFETY (SYRINGE) ×4 IMPLANT
TOWEL OR 17X26 10 PK STRL BLUE (TOWEL DISPOSABLE) ×4 IMPLANT
TOWEL OR NON WOVEN STRL DISP B (DISPOSABLE) ×4 IMPLANT
TUBING INSUFFLATION 10FT LAP (TUBING) IMPLANT
WATER STERILE IRR 1000ML POUR (IV SOLUTION) ×8 IMPLANT

## 2017-09-27 NOTE — Anesthesia Preprocedure Evaluation (Addendum)
Anesthesia Evaluation  Patient identified by MRN, date of birth, ID band Patient awake    Reviewed: Allergy & Precautions, NPO status , Patient's Chart, lab work & pertinent test results  Airway Mallampati: II  TM Distance: >3 FB Neck ROM: Full    Dental no notable dental hx.    Pulmonary neg pulmonary ROS,    Pulmonary exam normal breath sounds clear to auscultation       Cardiovascular hypertension, Pt. on medications negative cardio ROS Normal cardiovascular exam Rhythm:Regular Rate:Normal     Neuro/Psych negative neurological ROS  negative psych ROS   GI/Hepatic negative GI ROS, Neg liver ROS,   Endo/Other  negative endocrine ROS  Renal/GU negative Renal ROS  negative genitourinary   Musculoskeletal negative musculoskeletal ROS (+)   Abdominal   Peds negative pediatric ROS (+)  Hematology negative hematology ROS (+)   Anesthesia Other Findings   Reproductive/Obstetrics negative OB ROS                             Anesthesia Physical Anesthesia Plan  ASA: II  Anesthesia Plan: General   Post-op Pain Management:    Induction: Intravenous  PONV Risk Score and Plan: 2 and Ondansetron and Midazolam  Airway Management Planned: Oral ETT  Additional Equipment:   Intra-op Plan:   Post-operative Plan: Extubation in OR  Informed Consent: I have reviewed the patients History and Physical, chart, labs and discussed the procedure including the risks, benefits and alternatives for the proposed anesthesia with the patient or authorized representative who has indicated his/her understanding and acceptance.     Dental advisory given  Plan Discussed with: CRNA  Anesthesia Plan Comments:        Anesthesia Quick Evaluation  

## 2017-09-27 NOTE — Progress Notes (Signed)
Patient ID: Antonio Murphy, male   DOB: 1960/12/07, 57 y.o.   MRN: [id number]  . Post-op note  Subjective: The patient is doing well.  No complaints.  Objective: Vital signs in last 24 hours: Temp:  [97.2 F (36.2 C)-98.6 F (37 C)] 97.3 F (36.3 C) (06/10 1439) Pulse Rate:  [59-70] 67 (06/10 1439) Resp:  [12-19] 12 (06/10 1439) BP: (106-138)/(74-91) 124/74 (06/10 1439) SpO2:  [98 %-100 %] 100 % (06/10 1439) Weight:  [83.5 kg (184 lb)] 83.5 kg (184 lb) (06/10 0613)  Intake/Output from previous day: No intake/output data recorded. Intake/Output this shift: Total I/O In: 2475 [I.V.:1400; Other:75; IV Piggyback:1000] Out: 320 [Urine:200; Drains:20; Blood:100]  Physical Exam:  General: Alert and oriented. Abdomen: Soft, Nondistended. Incisions: Clean and dry.  Lab Results: Recent Labs    09/27/17 1106  HGB 13.6  HCT 39.9    Assessment/Plan: POD#0   1) Continue to monitor, IS, ambulate   Pryor Curia. MD   LOS: 0 days   Monet North,LES 09/27/2017, 3:01 PM

## 2017-09-27 NOTE — Plan of Care (Signed)

## 2017-09-27 NOTE — Care Management Note (Signed)
Case Management Note  Patient Details  Name: Tadan Shill MRN: [id number] Date of Birth: 1960-10-22  Subjective/Objective:  57 y/o m admitted w/Prostate Ca.CM referral for pcp-provided patient w/pcp listing-[facility]-he requested internal medicine/family medicine; also informed he can contact his customer service tel# for health insurance for a pcp also. Patient voiced understanding. No further CM needs.                  Action/Plan:d/c home.   Expected Discharge Date:                  Expected Discharge Plan:  Home/Self Care  In-House Referral:     Discharge planning Services  CM Consult  Post Acute Care Choice:    Choice offered to:     DME Arranged:    DME Agency:     HH Arranged:    HH Agency:     Status of Service:  In process, will continue to follow  If discussed at Long Length of Stay Meetings, dates discussed:    Additional Comments:  Dessa Phi, RN 09/27/2017, 3:39 PM

## 2017-09-27 NOTE — Discharge Instructions (Signed)

## 2017-09-27 NOTE — Transfer of Care (Signed)
Immediate Anesthesia Transfer of Care Note  Patient: Antonio Murphy  Procedure(s) Performed: XI ROBOTIC ASSISTED LAPAROSCOPIC RADICAL PROSTATECTOMY LEVEL 3 (N/A )  Patient Location: PACU  Anesthesia Type:General  Level of Consciousness: awake, alert , oriented and patient cooperative  Airway & Oxygen Therapy: Patient Spontanous Breathing and Patient connected to face mask oxygen  Post-op Assessment: Report given to RN and Post -op Vital signs reviewed and stable  Post vital signs: Reviewed and stable  Last Vitals:  Vitals Value Taken Time  BP 123/75 09/27/2017 10:48 AM  Temp 36.3 C 09/27/2017 10:48 AM  Pulse 68 09/27/2017 10:53 AM  Resp 18 09/27/2017 10:53 AM  SpO2 100 % 09/27/2017 10:53 AM  Vitals shown include unvalidated device data.  Last Pain:  Vitals:   09/27/17 1048  TempSrc:   PainSc: Asleep         Complications: No apparent anesthesia complications

## 2017-09-27 NOTE — Anesthesia Postprocedure Evaluation (Signed)
Anesthesia Post Note  Patient: Antonio Murphy  Procedure(s) Performed: XI ROBOTIC ASSISTED LAPAROSCOPIC RADICAL PROSTATECTOMY LEVEL 3 (N/A )     Patient location during evaluation: PACU Anesthesia Type: General Level of consciousness: awake and alert Pain management: pain level controlled Vital Signs Assessment: post-procedure vital signs reviewed and stable Respiratory status: spontaneous breathing, nonlabored ventilation and respiratory function stable Cardiovascular status: blood pressure returned to baseline and stable Postop Assessment: no apparent nausea or vomiting Anesthetic complications: no    Last Vitals:  Vitals:   09/27/17 1145 09/27/17 1159  BP: 123/80 133/87  Pulse: (!) 59 70  Resp: 12 18  Temp: (!) 36.2 C (!) 36.3 C  SpO2: 100% 100%    Last Pain:  Vitals:   09/27/17 1200  TempSrc:   PainSc: [place]

## 2017-09-27 NOTE — Op Note (Addendum)
Preoperative diagnosis: Clinically localized adenocarcinoma of the prostate (clinical stage T1c Nx Mx)  Postoperative diagnosis: Clinically localized adenocarcinoma of the prostate (clinical stage T1c Nx Mx)  Procedure:  1. Robotic assisted laparoscopic radical prostatectomy (bilateral nerve sparing)  Surgeon: Roxy Horseman, Brooke Bonito. M.D.  Resident: Dr. Fredrik Rigger  An assistant was required for this surgical procedure.  The duties of the assistant included but were not limited to suctioning, passing suture, camera manipulation, retraction. This procedure would not be able to be performed without an Environmental consultant.  Anesthesia: General  Complications: None  EBL: 100 mL  IVF:  1200 mL crystalloid  Specimens: 1. Prostate and seminal vesicles  Disposition of specimens: Pathology  Drains: 1. 20 Fr coude catheter 2. # 19 Blake pelvic drain  Indication: Antonio Murphy is a 57 y.o. year old patient with clinically localized prostate cancer.  After a thorough review of the management options for treatment of prostate cancer, he elected to proceed with surgical therapy and the above procedure(s).  We have discussed the potential benefits and risks of the procedure, side effects of the proposed treatment, the likelihood of the patient achieving the goals of the procedure, and any potential problems that might occur during the procedure or recuperation. Informed consent has been obtained.  Description of procedure:  The patient was taken to the operating room and a general anesthetic was administered. He was given preoperative antibiotics, placed in the dorsal lithotomy position, and prepped and draped in the usual sterile fashion. Next a preoperative timeout was performed. A urethral catheter was placed into the bladder and the abdomen was examined.  His prior scar was noted from the xiphoid process down to just below the umbilicus. A site was selected just off midline of the umbilicus on the left  for placement of the camera port. This was placed using a standard open Hassan technique which allowed entry into the peritoneal cavity under direct vision.  Omental adhesions could be palpated but a window was noted to the left and inferior portion of this site. An 8 mm robotic port was placed and a pneumoperitoneum established. The camera was then used to inspect the abdomen and there was no evidence of any intra-abdominal injuries.  There were significant omental adhesions superiorly just adjacent to the camera port site. The remaining abdominal ports were then placed. 8 mm robotic ports were placed in the right lower quadrant, left lower quadrant, and far left lateral abdominal wall. A 5 mm port was placed in the right upper quadrant and a 12 mm port was placed in the right lateral abdominal wall for laparoscopic assistance. These ports were placed sequentially and the adhesions were then taken down with laparoscopic scissors allowing the other ports to be placed. All ports were placed under direct vision without difficulty. The surgical cart was then docked.   Utilizing the cautery scissors, the bladder was reflected posteriorly allowing entry into the space of Retzius and identification of the endopelvic fascia and prostate. The periprostatic fat was then removed from the prostate allowing full exposure of the endopelvic fascia. The endopelvic fascia was then incised from the apex back to the base of the prostate bilaterally and the underlying levator muscle fibers were swept laterally off the prostate thereby isolating the dorsal venous complex. The dorsal vein was then stapled and divided with a 45 mm Flex Echelon stapler. Attention then turned to the bladder neck which was divided anteriorly thereby allowing entry into the bladder and exposure of the urethral catheter.  The catheter balloon was deflated and the catheter was brought into the operative field and used to retract the prostate anteriorly. The  posterior bladder neck was then examined and was divided allowing further dissection between the bladder and prostate posteriorly until the vasa deferentia and seminal vessels were identified. The vasa deferentia were isolated, divided, and lifted anteriorly. The seminal vesicles were dissected down to their tips with care to control the seminal vascular arterial blood supply. These structures were then lifted anteriorly and the space between Denonvillier's fascia and the anterior rectum was developed with a combination of sharp and blunt dissection. During this dissection, the plane was quite adherent likely related to his prior prostatitis.  This resulted in a difficult dissection of this plane and initially there was an incision into the prostate.  This was recognized and the dissection plane was corrected posteriorly and the correct plane was identified and carefully opened.This isolated the vascular pedicles of the prostate.  The lateral prostatic fascia was then sharply incised allowing release of the neurovascular bundles bilaterally. The vascular pedicles of the prostate were then ligated with Weck clips between the prostate and neurovascular bundles and divided with sharp cold scissor dissection resulting in neurovascular bundle preservation. The neurovascular bundles were then separated off the apex of the prostate and urethra bilaterally.  The urethra was then sharply transected allowing the prostate specimen to be disarticulated. The pelvis was copiously irrigated and hemostasis was ensured. There was no evidence for rectal injury.  Attention then turned to the right pelvic sidewall. The fibrofatty tissue between the external iliac vein, confluence of the iliac vessels, hypogastric artery, and Cooper's ligament was dissected free from the pelvic sidewall with care to preserve the obturator nerve. Weck clips were used for lymphostasis and hemostasis. An identical procedure was performed on the  contralateral side and the lymphatic packets were removed for permanent pathologic analysis.  Attention then turned to the urethral anastomosis. A 2-0 Vicryl slip knot was placed between Denonvillier's fascia, the posterior bladder neck, and the posterior urethra to reapproximate these structures. A double-armed 3-0 Monocryl suture was then used to perform a 360 running tension-free anastomosis between the bladder neck and urethra. A new urethral catheter was then placed into the bladder and irrigated. There were no blood clots within the bladder and the anastomosis appeared to be watertight. A #19 Blake drain was then brought through the left lateral 8 mm port site and positioned appropriately within the pelvis. It was secured to the skin with a nylon suture. The surgical cart was then undocked. The right lateral 12 mm port site was closed at the fascial level with a 0 Vicryl suture placed laparoscopically. All remaining ports were then removed under direct vision. The prostate specimen was removed intact within the Endopouch retrieval bag via the periumbilical camera port site. This fascial opening was closed with two running 0 Vicryl sutures. 0.25% Marcaine was then injected into all port sites and all incisions were reapproximated at the skin level with 4-0 Monocryl subcuticular sutures and Dermabond. The patient appeared to tolerate the procedure well and without complications. The patient was able to be extubated and transferred to the recovery unit in satisfactory condition.   Pryor Curia MD

## 2017-09-27 NOTE — Interval H&P Note (Signed)
History and Physical Interval Note:  09/27/2017 6:56 AM  Antonio Murphy  has presented today for surgery, with the diagnosis of PROSTATE CANCER  The various methods of treatment have been discussed with the patient and family. After consideration of risks, benefits and other options for treatment, the patient has consented to  Procedure(s) with comments: XI ROBOTIC ASSISTED LAPAROSCOPIC RADICAL PROSTATECTOMY LEVEL 3 (N/A) - ONLY NEEDS 210 MIN LYMPHADENECTOMY, PELVIC (Bilateral) as a surgical intervention .  The patient's history has been reviewed, patient examined, no change in status, stable for surgery.  I have reviewed the patient's chart and labs.  Questions were answered to the patient's satisfaction.     Lenette Rau,LES

## 2017-09-27 NOTE — Anesthesia Procedure Notes (Signed)
Procedure Name: Intubation Date/Time: 09/27/2017 7:31 AM Performed by: West Pugh, CRNA Pre-anesthesia Checklist: Patient identified, Emergency Drugs available, Suction available, Patient being monitored and Timeout performed Patient Re-evaluated:Patient Re-evaluated prior to induction Oxygen Delivery Method: Circle system utilized Preoxygenation: Pre-oxygenation with 100% oxygen Induction Type: IV induction and Cricoid Pressure applied Ventilation: Mask ventilation without difficulty Laryngoscope Size: Mac and 4 Grade View: Grade III Tube type: Oral Tube size: 7.5 mm Number of attempts: 1 Airway Equipment and Method: Stylet Placement Confirmation: ETT inserted through vocal cords under direct vision,  positive ETCO2,  CO2 detector and breath sounds checked- equal and bilateral Secured at: 22 cm Tube secured with: Tape Dental Injury: Teeth and Oropharynx as per pre-operative assessment

## 2017-09-28 ENCOUNTER — Encounter (HOSPITAL_COMMUNITY): Payer: Self-pay | Admitting: Urology

## 2017-09-28 DIAGNOSIS — C61 Malignant neoplasm of prostate: Secondary | ICD-10-CM | POA: Diagnosis not present

## 2017-09-28 LAB — HEMOGLOBIN AND HEMATOCRIT, BLOOD
HCT: 35.3 % — ABNORMAL LOW (ref 39.0–52.0)
Hemoglobin: 12.1 g/dL — ABNORMAL LOW (ref 13.0–17.0)

## 2017-09-28 MED ORDER — DOCUSATE SODIUM 100 MG PO CAPS: 100.0000 mg | ORAL_CAPSULE | Freq: Two times a day (BID) | ORAL | 0 refills | Status: DC

## 2017-09-28 MED ORDER — TRAMADOL HCL 50 MG PO TABS: 50.0000 mg | ORAL_TABLET | Freq: Four times a day (QID) | ORAL | Status: DC | PRN

## 2017-09-28 MED ORDER — BELLADONNA ALKALOIDS-OPIUM 16.2-60 MG RE SUPP
1.0000 | Freq: Once | RECTAL | Status: AC
  Administered 2017-09-28: 1 via RECTAL
  Filled 2017-09-28: qty 1

## 2017-09-28 MED ORDER — TRAMADOL HCL 50 MG PO TABS: 50.0000 mg | ORAL_TABLET | Freq: Four times a day (QID) | ORAL | 0 refills | Status: DC | PRN

## 2017-09-28 MED ORDER — BISACODYL 10 MG RE SUPP
10.0000 mg | Freq: Once | RECTAL | Status: AC
  Administered 2017-09-28: 10 mg via RECTAL
  Filled 2017-09-28: qty 1

## 2017-09-28 MED ORDER — SULFAMETHOXAZOLE-TRIMETHOPRIM 800-160 MG PO TABS: 1.0000 | ORAL_TABLET | Freq: Two times a day (BID) | ORAL | 0 refills | Status: DC

## 2017-09-28 NOTE — Discharge Summary (Signed)
  Date of admission: 09/27/2017  Date of discharge: 09/28/2017  Admission diagnosis: Prostate Cancer  Discharge diagnosis: Prostate Cancer  History and Physical: For full details, please see admission history and physical. Briefly, Antonio Murphy is a 57 y.o. gentleman with localized prostate cancer.  After discussing management/treatment options, he elected to proceed with surgical treatment.  Hospital Course: Misha Antonini was taken to the operating room on 09/27/2017 and underwent a robotic assisted laparoscopic radical prostatectomy. He tolerated this procedure well and without complications. Postoperatively, he was able to be transferred to a regular hospital room following recovery from anesthesia.  He was able to begin ambulating the night of surgery. He remained hemodynamically stable overnight.  He had excellent urine output with appropriately minimal output from his pelvic drain and his pelvic drain was removed on POD #1.  He was transitioned to oral pain medication, tolerated a clear liquid diet, and had met all discharge criteria and was able to be discharged home later on POD#1.  Laboratory values:  Recent Labs    09/27/17 1106 09/28/17 0455  HGB 13.6 12.1*  HCT 39.9 35.3*    Disposition: Home  Discharge instruction: He was instructed to be ambulatory but to refrain from heavy lifting, strenuous activity, or driving. He was instructed on urethral catheter care.  Discharge medications:   Allergies as of 09/28/2017   No Known Allergies     Medication List    TAKE these medications   amLODipine-benazepril 5-20 MG capsule Commonly known as:  LOTREL Take 1 capsule by mouth every morning.   docusate sodium 100 MG capsule Commonly known as:  COLACE Take 1 capsule (100 mg total) by mouth 2 (two) times daily.   sulfamethoxazole-trimethoprim 800-160 MG tablet Commonly known as:  BACTRIM DS,SEPTRA DS Take 1 tablet by mouth 2 (two) times daily. Begin the day prior to  appointment for catheter removal   tamsulosin 0.4 MG Caps capsule Commonly known as:  FLOMAX Take 1 capsule (0.4 mg total) daily by mouth. What changed:  when to take this   traMADol 50 MG tablet Commonly known as:  ULTRAM Take 1-2 tablets (50-100 mg total) by mouth every 6 (six) hours as needed (pain).       Followup: He will followup in 1 week for catheter removal and to discuss his surgical pathology results.

## 2017-09-28 NOTE — Progress Notes (Signed)
Pt had urine leakage around catheter and c/o urgency to void and "bladder feeling full".  Bladder scan performed and resulted in >800cc.  Dr. Alinda Money notified and orders received to gently irrigate catheter.  Irrigation performed (w/30cc NS) and immediate clear urine returned. Foley drained 750cc of clear yellow urine post irrigation. Will continue to monitor. Stacey Drain

## 2017-09-28 NOTE — Progress Notes (Signed)
Patient ID: Antonio Murphy, male   DOB: March 24, 1961, 57 y.o.   MRN: [id number]  1 Day Post-Op Subjective: The patient is doing well.  No nausea or vomiting. Pain is adequately controlled.  Objective: Vital signs in last 24 hours: Temp:  [97.2 F (36.2 C)-98.2 F (36.8 C)] 98.2 F (36.8 C) (06/11 0432) Pulse Rate:  [59-70] 64 (06/11 0432) Resp:  [12-19] 18 (06/11 0432) BP: (106-133)/(74-87) 111/77 (06/11 0432) SpO2:  [97 %-100 %] 97 % (06/11 0432)  Intake/Output from previous day: 06/10 0701 - 06/11 0700 In: 6230 [P.O.:1410; I.V.:3695; IV Piggyback:1050] Out: 9528 [UXLKG:4010; Drains:75; Blood:100] Intake/Output this shift: No intake/output data recorded.  Physical Exam:  General: Alert and oriented. CV: RRR Lungs: Clear bilaterally. GI: Soft, Nondistended. Incisions: Clean, dry, and intact Urine: Clear Extremities: Nontender, no erythema, no edema.  Lab Results: Recent Labs    09/27/17 1106 09/28/17 0455  HGB 13.6 12.1*  HCT 39.9 35.3*      Assessment/Plan: POD# 1 s/p robotic prostatectomy.  1) SL IVF 2) Ambulate, Incentive spirometry 3) Transition to oral pain medication 4) Dulcolax suppository 5) D/C pelvic drain 6) Plan for likely discharge later today   Antonio Murphy. MD   LOS: 0 days   Antonio Murphy,LES 09/28/2017, 7:34 AM

## 2017-09-28 NOTE — Progress Notes (Signed)
Pt c/o 'bladder fullness" and foley catheter not draining.  Hand irrigation with 30cc NS, immediate return of clear light yellow urine.  Leg strap adjusted and MD to bedside.  Will continue to monitor. Stacey Drain

## 2017-09-28 NOTE — Progress Notes (Signed)
Foley catheter draining clear yellow without difficulty.  Dr. Alinda Money notified of amount of output and that foley is draining without problems. Orders received to dc to home. Stacey Drain

## 2017-09-28 NOTE — Progress Notes (Signed)
Pt c/o of "bladder fullness".  Bladder scan performed and resulted in >500cc.  Foley Catheter irrigated by Charlton Haws with 30cc NS and immediate return of clear yellow urine.  Foley irrigated without difficulty.  Foley drained 750cc clear yellow urine post irrigation. Dr. Alinda Money notified. Will continue to monitor. Stacey Drain

## 2018-02-08 ENCOUNTER — Ambulatory Visit (INDEPENDENT_AMBULATORY_CARE_PROVIDER_SITE_OTHER): Payer: Commercial Managed Care - PPO | Admitting: Family Medicine

## 2018-02-08 ENCOUNTER — Encounter: Payer: Self-pay | Admitting: Family Medicine

## 2018-02-08 VITALS — BP 130/78 | HR 57 | Temp 98.7°F | Ht 73.0 in | Wt 190.2 lb

## 2018-02-08 DIAGNOSIS — L821 Other seborrheic keratosis: Secondary | ICD-10-CM

## 2018-02-08 DIAGNOSIS — I10 Essential (primary) hypertension: Secondary | ICD-10-CM | POA: Diagnosis not present

## 2018-02-08 DIAGNOSIS — Z86718 Personal history of other venous thrombosis and embolism: Secondary | ICD-10-CM | POA: Insufficient documentation

## 2018-02-08 DIAGNOSIS — Z1159 Encounter for screening for other viral diseases: Secondary | ICD-10-CM | POA: Insufficient documentation

## 2018-02-08 DIAGNOSIS — Z0001 Encounter for general adult medical examination with abnormal findings: Secondary | ICD-10-CM

## 2018-02-08 DIAGNOSIS — Z1211 Encounter for screening for malignant neoplasm of colon: Secondary | ICD-10-CM | POA: Diagnosis not present

## 2018-02-08 MED ORDER — AMLODIPINE BESY-BENAZEPRIL HCL 5-20 MG PO CAPS: 1.0000 | ORAL_CAPSULE | Freq: Every morning | ORAL | 2 refills | Status: DC

## 2018-02-08 NOTE — Patient Instructions (Signed)
DASH Eating Plan DASH stands for "Dietary Approaches to Stop Hypertension." The DASH eating plan is a healthy eating plan that has been shown to reduce high blood pressure (hypertension). It may also reduce your risk for type 2 diabetes, heart disease, and stroke. The DASH eating plan may also help with weight loss. What are tips for following this plan? General guidelines  Avoid eating more than 2,300 mg (milligrams) of salt (sodium) a day. If you have hypertension, you may need to reduce your sodium intake to 1,500 mg a day.  Limit alcohol intake to no more than 1 drink a day for nonpregnant women and 2 drinks a day for men. One drink equals 12 oz of beer, 5 oz of wine, or 1 oz of hard liquor.  Work with your health care provider to maintain a healthy body weight or to lose weight. Ask what an ideal weight is for you.  Get at least 30 minutes of exercise that causes your heart to beat faster (aerobic exercise) most days of the week. Activities may include walking, swimming, or biking.  Work with your health care provider or diet and nutrition specialist (dietitian) to adjust your eating plan to your individual calorie needs. Reading food labels  Check food labels for the amount of sodium per serving. Choose foods with less than 5 percent of the Daily Value of sodium. Generally, foods with less than 300 mg of sodium per serving fit into this eating plan.  To find whole grains, look for the word "whole" as the first word in the ingredient list. Shopping  Buy products labeled as "low-sodium" or "no salt added."  Buy fresh foods. Avoid canned foods and premade or frozen meals. Cooking  Avoid adding salt when cooking. Use salt-free seasonings or herbs instead of table salt or sea salt. Check with your health care provider or pharmacist before using salt substitutes.  Do not fry foods. Cook foods using healthy methods such as baking, boiling, grilling, and broiling instead.  Cook with  heart-healthy oils, such as olive, canola, soybean, or sunflower oil. Meal planning   Eat a balanced diet that includes: ? 5 or more servings of fruits and vegetables each day. At each meal, try to fill half of your plate with fruits and vegetables. ? Up to 6-8 servings of whole grains each day. ? Less than 6 oz of lean meat, poultry, or fish each day. A 3-oz serving of meat is about the same size as a deck of cards. One egg equals 1 oz. ? 2 servings of low-fat dairy each day. ? A serving of nuts, seeds, or beans 5 times each week. ? Heart-healthy fats. Healthy fats called Omega-3 fatty acids are found in foods such as flaxseeds and coldwater fish, like sardines, salmon, and mackerel.  Limit how much you eat of the following: ? Canned or prepackaged foods. ? Food that is high in trans fat, such as fried foods. ? Food that is high in saturated fat, such as fatty meat. ? Sweets, desserts, sugary drinks, and other foods with added sugar. ? Full-fat dairy products.  Do not salt foods before eating.  Try to eat at least 2 vegetarian meals each week.  Eat more home-cooked food and less restaurant, buffet, and fast food.  When eating at a restaurant, ask that your food be prepared with less salt or no salt, if possible. What foods are recommended? The items listed may not be a complete list. Talk with your dietitian about what   dietary choices are best for you. Grains Whole-grain or whole-wheat bread. Whole-grain or whole-wheat pasta. Brown rice. Oatmeal. Quinoa. Bulgur. Whole-grain and low-sodium cereals. Pita bread. Low-fat, low-sodium crackers. Whole-wheat flour tortillas. Vegetables Fresh or frozen vegetables (raw, steamed, roasted, or grilled). Low-sodium or reduced-sodium tomato and vegetable juice. Low-sodium or reduced-sodium tomato sauce and tomato paste. Low-sodium or reduced-sodium canned vegetables. Fruits All fresh, dried, or frozen fruit. Canned fruit in natural juice (without  added sugar). Meat and other protein foods Skinless chicken or turkey. Ground chicken or turkey. Pork with fat trimmed off. Fish and seafood. Egg whites. Dried beans, peas, or lentils. Unsalted nuts, nut butters, and seeds. Unsalted canned beans. Lean cuts of beef with fat trimmed off. Low-sodium, lean deli meat. Dairy Low-fat (1%) or fat-free (skim) milk. Fat-free, low-fat, or reduced-fat cheeses. Nonfat, low-sodium ricotta or cottage cheese. Low-fat or nonfat yogurt. Low-fat, low-sodium cheese. Fats and oils Soft margarine without trans fats. Vegetable oil. Low-fat, reduced-fat, or light mayonnaise and salad dressings (reduced-sodium). Canola, safflower, olive, soybean, and sunflower oils. Avocado. Seasoning and other foods Herbs. Spices. Seasoning mixes without salt. Unsalted popcorn and pretzels. Fat-free sweets. What foods are not recommended? The items listed may not be a complete list. Talk with your dietitian about what dietary choices are best for you. Grains Baked goods made with fat, such as croissants, muffins, or some breads. Dry pasta or rice meal packs. Vegetables Creamed or fried vegetables. Vegetables in a cheese sauce. Regular canned vegetables (not low-sodium or reduced-sodium). Regular canned tomato sauce and paste (not low-sodium or reduced-sodium). Regular tomato and vegetable juice (not low-sodium or reduced-sodium). Pickles. Olives. Fruits Canned fruit in a light or heavy syrup. Fried fruit. Fruit in cream or butter sauce. Meat and other protein foods Fatty cuts of meat. Ribs. Fried meat. Bacon. Sausage. Bologna and other processed lunch meats. Salami. Fatback. Hotdogs. Bratwurst. Salted nuts and seeds. Canned beans with added salt. Canned or smoked fish. Whole eggs or egg yolks. Chicken or turkey with skin. Dairy Whole or 2% milk, cream, and half-and-half. Whole or full-fat cream cheese. Whole-fat or sweetened yogurt. Full-fat cheese. Nondairy creamers. Whipped toppings.  Processed cheese and cheese spreads. Fats and oils Butter. Stick margarine. Lard. Shortening. Ghee. Bacon fat. Tropical oils, such as coconut, palm kernel, or palm oil. Seasoning and other foods Salted popcorn and pretzels. Onion salt, garlic salt, seasoned salt, table salt, and sea salt. Worcestershire sauce. Tartar sauce. Barbecue sauce. Teriyaki sauce. Soy sauce, including reduced-sodium. Steak sauce. Canned and packaged gravies. Fish sauce. Oyster sauce. Cocktail sauce. Horseradish that you find on the shelf. Ketchup. Mustard. Meat flavorings and tenderizers. Bouillon cubes. Hot sauce and Tabasco sauce. Premade or packaged marinades. Premade or packaged taco seasonings. Relishes. Regular salad dressings. Where to find more information:  National Heart, Lung, and Blood Institute: www.nhlbi.nih.gov  American Heart Association: www.heart.org Summary  The DASH eating plan is a healthy eating plan that has been shown to reduce high blood pressure (hypertension). It may also reduce your risk for type 2 diabetes, heart disease, and stroke.  With the DASH eating plan, you should limit salt (sodium) intake to 2,300 mg a day. If you have hypertension, you may need to reduce your sodium intake to 1,500 mg a day.  When on the DASH eating plan, aim to eat more fresh fruits and vegetables, whole grains, lean proteins, low-fat dairy, and heart-healthy fats.  Work with your health care provider or diet and nutrition specialist (dietitian) to adjust your eating plan to your individual   calorie needs. This information is not intended to replace advice given to you by your health care provider. Make sure you discuss any questions you have with your health care provider. Document Released: 03/26/2011 Document Revised: 03/30/2016 Document Reviewed: 03/30/2016 Elsevier Interactive Patient Education  2018 [place] Maintenance, Male A healthy lifestyle and preventive care is important for your  health and wellness. Ask your health care provider about what schedule of regular examinations is right for you. What should I know about weight and diet? Eat a Healthy Diet  Eat plenty of vegetables, fruits, whole grains, low-fat dairy products, and lean protein.  Do not eat a lot of foods high in solid fats, added sugars, or salt.  Maintain a Healthy Weight Regular exercise can help you achieve or maintain a healthy weight. You should:  Do at least 150 minutes of exercise each week. The exercise should increase your heart rate and make you sweat (moderate-intensity exercise).  Do strength-training exercises at least twice a week.  Watch Your Levels of Cholesterol and Blood Lipids  Have your blood tested for lipids and cholesterol every 5 years starting at 58 years of age. If you are at high risk for heart disease, you should start having your blood tested when you are 58 years old. You may need to have your cholesterol levels checked more often if: ? Your lipid or cholesterol levels are high. ? You are older than 58 years of age. ? You are at high risk for heart disease.  What should I know about cancer screening? Many types of cancers can be detected early and may often be prevented. Lung Cancer  You should be screened every year for lung cancer if: ? You are a current smoker who has smoked for at least 30 years. ? You are a former smoker who has quit within the past 15 years.  Talk to your health care provider about your screening options, when you should start screening, and how often you should be screened.  Colorectal Cancer  Routine colorectal cancer screening usually begins at 58 years of age and should be repeated every 5-10 years until you are 58 years old. You may need to be screened more often if early forms of precancerous polyps or small growths are found. Your health care provider may recommend screening at an earlier age if you have risk factors for colon  cancer.  Your health care provider may recommend using home test kits to check for hidden blood in the stool.  A small camera at the end of a tube can be used to examine your colon (sigmoidoscopy or colonoscopy). This checks for the earliest forms of colorectal cancer.  Prostate and Testicular Cancer  Depending on your age and overall health, your health care provider may do certain tests to screen for prostate and testicular cancer.  Talk to your health care provider about any symptoms or concerns you have about testicular or prostate cancer.  Skin Cancer  Check your skin from head to toe regularly.  Tell your health care provider about any new moles or changes in moles, especially if: ? There is a change in a mole's size, shape, or color. ? You have a mole that is larger than a pencil eraser.  Always use sunscreen. Apply sunscreen liberally and repeat throughout the day.  Protect yourself by wearing long sleeves, pants, a wide-brimmed hat, and sunglasses when outside.  What should I know about heart disease, diabetes, and high blood pressure?  If you are 60-19 years of age, have your blood pressure checked every 3-5 years. If you are 22 years of age or older, have your blood pressure checked every year. You should have your blood pressure measured twice-once when you are at a hospital or clinic, and once when you are not at a hospital or clinic. Record the average of the two measurements. To check your blood pressure when you are not at a hospital or clinic, you can use: ? An automated blood pressure machine at a pharmacy. ? A home blood pressure monitor.  Talk to your health care provider about your target blood pressure.  If you are between 76-65 years old, ask your health care provider if you should take aspirin to prevent heart disease.  Have regular diabetes screenings by checking your fasting blood sugar level. ? If you are at a normal weight and have a low risk for  diabetes, have this test once every three years after the age of 4. ? If you are overweight and have a high risk for diabetes, consider being tested at a younger age or more often.  A one-time screening for abdominal aortic aneurysm (AAA) by ultrasound is recommended for men aged 72-75 years who are current or former smokers. What should I know about preventing infection? Hepatitis B If you have a higher risk for hepatitis B, you should be screened for this virus. Talk with your health care provider to find out if you are at risk for hepatitis B infection. Hepatitis C Blood testing is recommended for:  Everyone born from [birth year] through 1966.  Anyone with known risk factors for hepatitis C.  Sexually Transmitted Diseases (STDs)  You should be screened each year for STDs including gonorrhea and chlamydia if: ? You are sexually active and are younger than 58 years of age. ? You are older than 58 years of age and your health care provider tells you that you are at risk for this type of infection. ? Your sexual activity has changed since you were last screened and you are at an increased risk for chlamydia or gonorrhea. Ask your health care provider if you are at risk.  Talk with your health care provider about whether you are at high risk of being infected with HIV. Your health care provider may recommend a prescription medicine to help prevent HIV infection.  What else can I do?  Schedule regular health, dental, and eye exams.  Stay current with your vaccines (immunizations).  Do not use any tobacco products, such as cigarettes, chewing tobacco, and e-cigarettes. If you need help quitting, ask your health care provider.  Limit alcohol intake to no more than 2 drinks per day. One drink equals 12 ounces of beer, 5 ounces of wine, or 1 ounces of hard liquor.  Do not use street drugs.  Do not share needles.  Ask your health care provider for help if you need support or information about  quitting drugs.  Tell your health care provider if you often feel depressed.  Tell your health care provider if you have ever been abused or do not feel safe at home. This information is not intended to replace advice given to you by your health care provider. Make sure you discuss any questions you have with your health care provider. Document Released: 10/03/2007 Document Revised: 12/04/2015 Document Reviewed: 01/08/2015 Elsevier Interactive Patient Education  [employer].

## 2018-02-08 NOTE — Progress Notes (Signed)
Subjective:  Patient ID: Antonio Murphy, male    DOB: 1961/01/28  Age: 57 y.o. MRN: [id number]  CC: Establish Care and Annual Exam   HPI Antonio Murphy presents for his hypertension that is been well controlled with Lotrel.  He has taken this medication for years without appreciable side effect.  He occasionally feels a scratchy throat that he attributes to this medication but otherwise is not particularly bothered by it.  He has had significant past medical history in the last 5 years.  Status post excision of benign cystic lesion involving his spleen and colon.  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  He has had the Prevnar 13 pneumococcal 23 and meningococcal vaccines.  He is status post DVT and was believed to be secondary to prolonged sitting on a given day that had involved air travel and prolonged car travel.  There is no family history of DVT.  He has no prior history of DVT.  Elevated PSA discovered earlier this year he is status post robotically assisted prostatectomy this past June.  He is no longer dealing with continence.  He does have some minor ED issues.  He is a runner.  He lives with his wife.  He has 2 grown children who live in [place] in [place].  He is lived here in [place] over the last year.  Last colonoscopy was 5 years ago.  Benign polyp was removed and follow-up was recommended in 5 years which it is due now.  He is interested in being checked for hep C.  He did have blood transfusions 5 years ago is never used self injected drugs.  He would like his nasal titers checked.  He does not smoke or use illicit drugs.  He drinks alcohol on occasion.  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   He has a  past surgical history that includes Abdominal surgery (2014); Prostate biopsy (08-02-2017   dr borden office); Partial colectomy (2014   in [place]); Esophagogastroduodenoscopy (07/2013); Knee arthroscopy (Left, 1995); and Robot assisted laparoscopic radical prostatectomy (N/A, 09/27/2017).   His family history includes Aneurysm in his father; Lung cancer in his mother.He reports that he has never smoked. He has never used smokeless tobacco. He reports that he drinks alcohol. He reports that he does not use drugs.  Outpatient Medications Prior to Visit  Medication Sig Dispense Refill  . amLODipine-benazepril (LOTREL) 5-20 MG capsule Take 1 capsule by mouth every morning.     . docusate sodium (COLACE) 100 MG capsule Take 1 capsule (100 mg total) by mouth 2 (two) times daily. 30 capsule 0  . sulfamethoxazole-trimethoprim (BACTRIM DS,SEPTRA DS) 800-160 MG tablet Take 1 tablet by mouth 2 (two) times daily. Begin the day prior to appointment for catheter removal 6 tablet 0  . traMADol (ULTRAM) 50 MG tablet Take 1-2 tablets (50-100 mg total) by mouth every 6 (six) hours as needed (pain). 20 tablet 0   No facility-administered medications prior to visit.     ROS Review of Systems  Constitutional: Negative for chills, diaphoresis, fatigue, fever and unexpected weight change.  HENT: Negative.   Eyes: Negative for photophobia and visual disturbance.  Respiratory: Negative.   Cardiovascular: Negative.   Endocrine: Negative for polyphagia and polyuria.  Genitourinary: Negative for  decreased urine volume, difficulty urinating and frequency.  Musculoskeletal: Negative for gait problem, joint swelling and myalgias.  Skin: Positive for color change.  Allergic/Immunologic: Negative for immunocompromised state.  Neurological: Negative for syncope, speech difficulty and numbness.  Hematological: Does not bruise/bleed easily.  Psychiatric/Behavioral: Negative.     Objective:  BP 130/78   Pulse (!) 57    Temp 98.7 F (37.1 C) (Oral)   Ht 6\' 1"  (1.854 m)   Wt 190 lb 4 oz (86.3 kg)   SpO2 97%   BMI 25.10 kg/m   Physical Exam  Constitutional: He is oriented to person, place, and time. He appears well-developed and well-nourished. No distress.  HENT:  Head: Normocephalic and atraumatic.  Right Ear: External ear normal.  Left Ear: External ear normal.  Mouth/Throat: Oropharynx is clear and moist. No oropharyngeal exudate.  Eyes: Pupils are equal, round, and reactive to light. Conjunctivae and EOM are normal. Right eye exhibits no discharge. Left eye exhibits no discharge. No scleral icterus.  Neck: Neck supple. No JVD present. No tracheal deviation present. No thyromegaly present.  Cardiovascular: Normal rate, regular rhythm and normal heart sounds.  Pulmonary/Chest: Effort normal and breath sounds normal.  Abdominal: Soft. Bowel sounds are normal. He exhibits no distension and no mass. There is no tenderness. There is no rebound and no guarding.    Well healed midline surgical scar just to the left of the midline.   Newer 2cm abd scars consistent with recent ho laproscopic prostatectomy.    Musculoskeletal: He exhibits no edema.  Lymphadenopathy:    He has no cervical adenopathy.  Neurological: He is alert and oriented to person, place, and time.  Skin: Skin is warm and dry. Capillary refill takes less than 2 seconds. He is not diaphoretic.  Psychiatric: He has a normal mood and affect. His behavior is normal.      Assessment & Plan:   Antonio Murphy was seen today for establish care and annual exam.  Diagnoses and all orders for this visit:  Essential hypertension -     CBC; Future -     Comprehensive metabolic panel; Future -     Urinalysis, Routine w reflex microscopic; Future -     amLODipine-benazepril (LOTREL) 5-20 MG capsule; Take 1 capsule by mouth every morning.  Measles screening -     Measles ([place]) Antibody IgG; Future  Encounter for health maintenance  examination with abnormal findings -     Lipid panel; Future  Encounter for hepatitis C screening test for low risk patient -     Hepatitis C antibody; Future  Screen for colon cancer -     Ambulatory referral to Gastroenterology  Seborrheic keratoses -     Ambulatory referral to Dermatology  History of DVT (deep vein thrombosis)   I have discontinued Reed Breech traMADol, docusate sodium, and sulfamethoxazole-trimethoprim. I am also having him maintain his amLODipine-benazepril.  Meds ordered this encounter  Medications  . amLODipine-benazepril (LOTREL) 5-20 MG capsule    Sig: Take 1 capsule by mouth every morning.    Dispense:  100 capsule    Refill:  2   Anticipatory guidance was given to patient for health maintenance and disease prevention.  He will continue his active lifestyle and avoid gaining weight to avoid diabetes.  Given information on the DASH diet.  Follow-up in 6 months or sooner as needed.  Follow-up: Return in about 6 months (around 08/10/2018).  Libby Maw, MD

## 2018-02-16 ENCOUNTER — Other Ambulatory Visit (INDEPENDENT_AMBULATORY_CARE_PROVIDER_SITE_OTHER): Payer: Commercial Managed Care - PPO

## 2018-02-16 DIAGNOSIS — Z0001 Encounter for general adult medical examination with abnormal findings: Secondary | ICD-10-CM

## 2018-02-16 DIAGNOSIS — I10 Essential (primary) hypertension: Secondary | ICD-10-CM | POA: Diagnosis not present

## 2018-02-16 DIAGNOSIS — Z1159 Encounter for screening for other viral diseases: Secondary | ICD-10-CM

## 2018-02-16 LAB — CBC
HEMATOCRIT: 45.2 % (ref 39.0–52.0)
HEMOGLOBIN: 15.2 g/dL (ref 13.0–17.0)
MCHC: 33.6 g/dL (ref 30.0–36.0)
MCV: 93.6 fl (ref 78.0–100.0)
Platelets: 315 10*3/uL (ref 150.0–400.0)
RBC: 4.83 Mil/uL (ref 4.22–5.81)
RDW: 13.9 % (ref 11.5–15.5)
WBC: 5.7 10*3/uL (ref 4.0–10.5)

## 2018-02-16 LAB — LIPID PANEL
CHOLESTEROL: 193 mg/dL (ref 0–200)
HDL: 78.3 mg/dL (ref >39.00)
LDL CALC: 102 mg/dL — AB (ref 0–99)
NonHDL: 114.76
TRIGLYCERIDES: 64 mg/dL (ref 0.0–149.0)
Total CHOL/HDL Ratio: 2
VLDL: 12.8 mg/dL (ref 0.0–40.0)

## 2018-02-16 LAB — URINALYSIS, ROUTINE W REFLEX MICROSCOPIC
Bilirubin Urine: NEGATIVE
Hgb urine dipstick: NEGATIVE
Ketones, ur: NEGATIVE
Leukocytes, UA: NEGATIVE
Nitrite: NEGATIVE
RBC / HPF: NONE SEEN (ref 0–?)
SPECIFIC GRAVITY, URINE: 1.025 (ref 1.000–1.030)
Total Protein, Urine: NEGATIVE
URINE GLUCOSE: NEGATIVE
Urobilinogen, UA: 0.2 (ref 0.0–1.0)
WBC UA: NONE SEEN (ref 0–?)
pH: 6 (ref 5.0–8.0)

## 2018-02-16 LAB — COMPREHENSIVE METABOLIC PANEL
ALBUMIN: 4.5 g/dL (ref 3.5–5.2)
ALT: 16 U/L (ref 0–53)
AST: 16 U/L (ref 0–37)
Alkaline Phosphatase: 55 U/L (ref 39–117)
BUN: 16 mg/dL (ref 6–23)
CALCIUM: 10.3 mg/dL (ref 8.4–10.5)
CHLORIDE: 105 meq/L (ref 96–112)
CO2: 29 mEq/L (ref 19–32)
Creatinine, Ser: 1.15 mg/dL (ref 0.40–1.50)
GFR: 69.4 mL/min (ref >60.00)
Glucose, Bld: 117 mg/dL — ABNORMAL HIGH (ref 70–99)
POTASSIUM: 4.7 meq/L (ref 3.5–5.1)
SODIUM: 141 meq/L (ref 135–145)
Total Bilirubin: 1.2 mg/dL (ref 0.2–1.2)
Total Protein: 7.4 g/dL (ref 6.0–8.3)

## 2018-02-17 LAB — RUBEOLA ANTIBODY IGG: Rubeola IgG: >300 AU/mL

## 2018-02-17 LAB — EXTRA LAV TOP TUBE

## 2018-02-17 LAB — HEPATITIS C ANTIBODY
Hepatitis C Ab: NONREACTIVE
SIGNAL TO CUT-OFF: 0.01 (ref <1.00)

## 2018-04-28 ENCOUNTER — Encounter: Payer: Self-pay | Admitting: Family Medicine

## 2018-06-28 DIAGNOSIS — C61 Malignant neoplasm of prostate: Secondary | ICD-10-CM | POA: Diagnosis not present

## 2018-07-23 ENCOUNTER — Encounter (HOSPITAL_BASED_OUTPATIENT_CLINIC_OR_DEPARTMENT_OTHER): Payer: Self-pay | Admitting: Emergency Medicine

## 2018-07-23 ENCOUNTER — Emergency Department (HOSPITAL_BASED_OUTPATIENT_CLINIC_OR_DEPARTMENT_OTHER): Payer: Commercial Managed Care - PPO

## 2018-07-23 ENCOUNTER — Other Ambulatory Visit: Payer: Self-pay

## 2018-07-23 ENCOUNTER — Emergency Department (HOSPITAL_BASED_OUTPATIENT_CLINIC_OR_DEPARTMENT_OTHER)
Admission: EM | Admit: 2018-07-23 | Discharge: 2018-07-23 | Disposition: A | Discharge status: Home / Self Care | Payer: Commercial Managed Care - PPO | Attending: Emergency Medicine | Admitting: Emergency Medicine

## 2018-07-23 DIAGNOSIS — I82442 Acute embolism and thrombosis of left tibial vein: Secondary | ICD-10-CM | POA: Diagnosis not present

## 2018-07-23 DIAGNOSIS — Z79899 Other long term (current) drug therapy: Secondary | ICD-10-CM | POA: Diagnosis not present

## 2018-07-23 DIAGNOSIS — I10 Essential (primary) hypertension: Secondary | ICD-10-CM | POA: Insufficient documentation

## 2018-07-23 DIAGNOSIS — M79605 Pain in left leg: Secondary | ICD-10-CM | POA: Diagnosis present

## 2018-07-23 DIAGNOSIS — I82542 Chronic embolism and thrombosis of left tibial vein: Secondary | ICD-10-CM | POA: Insufficient documentation

## 2018-07-23 DIAGNOSIS — Z8546 Personal history of malignant neoplasm of prostate: Secondary | ICD-10-CM | POA: Diagnosis not present

## 2018-07-23 MED ORDER — ELIQUIS 5 MG VTE STARTER PACK: ORAL_TABLET | ORAL | 0 refills | Status: DC

## 2018-07-23 MED ORDER — RIVAROXABAN (XARELTO) VTE STARTER PACK (15 & 20 MG): ORAL_TABLET | ORAL | 0 refills | Status: DC

## 2018-07-23 NOTE — ED Triage Notes (Signed)
L calf pain since yesterday. Hx of DVT

## 2018-07-23 NOTE — ED Provider Notes (Addendum)
[facility] EMERGENCY DEPARTMENT Provider Note   CSN: [id number] Arrival date & time: 07/23/18  1009    History   Chief Complaint Chief Complaint  Patient presents with   Leg Pain    HPI Ankur Snowdon is a 58 y.o. male.     58yo M w/ PMH including partial colectomy, splenectomy, DVT, SBO, HTN, prostate CA who p/w L calf pain. Pt began having mild L calf pain yesterday that has continued today. No trauma or change in physical activity. He became concerned about DVT as he has h/o DVT after long travel in 2017. Not currently on anticoagulation. Denies associated CP, SOB, cough, fevers, or problems walking.  The history is provided by the patient.  Leg Pain    Past Medical History:  Diagnosis Date   History of benign colon tumor 2014   S/P  PARTIAL COLECOTMY W/  SPLENECTOMY   History of DVT of lower extremity 08/2015   left leg -- treated w/ 3 months with eliquis   History of small bowel obstruction 2014   POST-OP PARTIAL COLECOTMY -- S/P  LYSIS ADHESIONS   History of urinary retention 02/2011   SIRS SECONDARY TO UTI/  ACUTE PROSTATITIS   Hypertension    Prostate cancer ([place]) UROLOGIST-  DR Alinda Money   dx 08-02-2017-- Stage T1c, Gleason 3+3,  PSA 6.94--  treatment plan prostatectomy   Wears glasses    Wears partial dentures    upper    Patient Active Problem List   Diagnosis Date Noted   Measles screening 02/08/2018   Screen for colon cancer 02/08/2018   Seborrheic keratoses 02/08/2018   History of DVT (deep vein thrombosis) 02/08/2018   Prostate cancer ([place]) 09/27/2017   Malignant neoplasm of prostate ([place]) 09/08/2017   SIRS (systemic inflammatory response syndrome) ([place]) 02/28/2017   Urinary retention 02/28/2017   Acute cystitis without hematuria 02/28/2017   Hypertension 02/28/2017    Past Surgical History:  Procedure Laterality Date   ABDOMINAL SURGERY  2014   LYSIS ADHESIONS FOR SMALL BOWEL OBSTRUCTION    ESOPHAGOGASTRODUODENOSCOPY  07/2013   GASTROPANCREATIC SHUNT TO DRAIN PANCREATIC PSEUDOCYST   KNEE ARTHROSCOPY Left 1995   PARTIAL COLECTOMY  2014   in [place] (BENIGN PER PT)   PROSTATE BIOPSY  08-02-2017   dr borden office   ROBOT ASSISTED LAPAROSCOPIC RADICAL PROSTATECTOMY N/A 09/27/2017   Procedure: XI ROBOTIC ASSISTED LAPAROSCOPIC RADICAL PROSTATECTOMY LEVEL 3;  Surgeon: Raynelle Bring, MD;  Location: [facility];  Service: Urology;  Laterality: N/A;  ONLY NEEDS 210 MIN        Home Medications    Prior to Admission medications   Medication Sig Start Date End Date Taking? Authorizing Provider  amLODipine-benazepril (LOTREL) 5-20 MG capsule Take 1 capsule by mouth every morning. 02/08/18   Libby Maw, MD  Eliquis DVT/PE Starter Pack [facility] STARTER PACK) 5 MG TABS Take as directed on package: start with two-5mg  tablets twice daily for 7 days. On day 8, switch to one-5mg  tablet twice daily. 07/23/18   Umar Patmon, Wenda Overland, MD    Family History Family History  Problem Relation Age of Onset   Lung cancer Mother    Aneurysm Father     Social History Social History   Tobacco Use   Smoking status: Never Smoker   Smokeless tobacco: Never Used  Substance Use Topics   Alcohol use: Yes    Comment: Ocassionally.   Drug use: No  Allergies   Patient has no known allergies.   Review of Systems Review of Systems All other systems reviewed and are negative except that which was mentioned in HPI   Physical Exam Updated Vital Signs BP (!) 148/97 (BP Location: Right Arm)    Pulse 69    Temp 98.6 F (37 C) (Oral)    Resp 16    Ht 6\' 1"  (1.854 m)    Wt 83.9 kg    SpO2 99%    BMI 24.41 kg/m   Physical Exam Vitals signs and nursing note reviewed.  Constitutional:      General: He is not in acute distress.    Appearance: He is well-developed.  HENT:     Head: Normocephalic and atraumatic.  Eyes:      Conjunctiva/sclera: Conjunctivae normal.  Neck:     Musculoskeletal: Neck supple.  Cardiovascular:     Pulses: Normal pulses.  Pulmonary:     Effort: Pulmonary effort is normal.  Musculoskeletal: Normal range of motion.        General: No swelling.     Comments: Very mild tenderness central L calf  Skin:    General: Skin is warm and dry.     Findings: No bruising or erythema.  Neurological:     Mental Status: He is alert and oriented to person, place, and time.     Comments: Fluent speech, normal gait, normal sensation BLE  Psychiatric:        Judgment: Judgment normal.      ED Treatments / Results  Labs (all labs ordered are listed, but only abnormal results are displayed) Labs Reviewed - No data to display  EKG None  Radiology US Venous Img Lower  Left (dvt Study)  Result Date: 07/23/2018 CLINICAL DATA:  Left lower leg pain beginning yesterday. Previous history of left leg DVT. EXAM: LEFT LOWER EXTREMITY VENOUS DOPPLER ULTRASOUND TECHNIQUE: Gray-scale sonography with graded compression, as well as color Doppler and duplex ultrasound were performed to evaluate the lower extremity deep venous systems from the level of the common femoral vein and including the common femoral, femoral, profunda femoral, popliteal and calf veins including the posterior tibial, peroneal and gastrocnemius veins when visible. The superficial great saphenous vein was also interrogated. Spectral Doppler was utilized to evaluate flow at rest and with distal augmentation maneuvers in the common femoral, femoral and popliteal veins. COMPARISON:  None. FINDINGS: Contralateral Common Femoral Vein: Respiratory phasicity is normal and symmetric with the symptomatic side. No evidence of thrombus. Normal compressibility. Common Femoral Vein: No evidence of thrombus. Normal compressibility, respiratory phasicity and response to augmentation. Saphenofemoral Junction: No evidence of thrombus. Normal compressibility and  flow on color Doppler imaging. Profunda Femoral Vein: No evidence of thrombus. Normal compressibility and flow on color Doppler imaging. Femoral Vein: No evidence of thrombus. Normal compressibility, respiratory phasicity and response to augmentation. Popliteal Vein: No evidence of thrombus. Normal compressibility, respiratory phasicity and response to augmentation. Calf Veins: Distal left posterior tibial vein is noncompressible and shows nonocclusive thrombus, with some flow seen on color Doppler ultrasound. This vein is nondilated. No other sites of venous thrombus seen. Superficial Great Saphenous Vein: No evidence of thrombus. Normal compressibility. Venous Reflux:  None. Other Findings:  None. IMPRESSION: Nonocclusive DVT in distal left posterior tibial vein, which is likely subacute or chronic in age. Electronically Signed   By: Earle Gell M.D.   On: 07/23/2018 11:21    Procedures Procedures (including critical care time)  Medications Ordered in  ED Medications - No data to display   Initial Impression / Assessment and Plan / ED Course  I have reviewed the triage vital signs and the nursing notes.  Pertinent imaging results that were available during my care of the patient were reviewed by me and considered in my medical decision making (see chart for details).       No obvious swelling on exam, no skin changes to suggest infection, normal pulses and sensation distally.  Ultrasound shows nonocclusive DVT in distal left posterior tibial vein, likely subacute or chronic.  I discussed these findings with the patient.  I explained that treatment of blood clots in this area is unclear, with some providers recommending anticoagulation and some recommending observation.  I had a long discussion regarding options and treatment, including starting Eliquis with risk of bleeding versus observation with serial ultrasounds with small risk of propagation of clot.  He voiced understanding of options and  ultimately decided to restart anticoagulation, which he has tolerated previously, and follow-up with PCP for further discussion on whether he should stay on anticoagulation.  I feel this option is appropriate given his history of DVT previously. He has normal Cr/GFR documented from a few months ago and no history of kidney disease.  Discussed supportive measures and extensively reviewed return precautions including any chest pain or shortness of breath.  He voiced understanding.  Final Clinical Impressions(s) / ED Diagnoses   Final diagnoses:  Chronic posterior tibial vein thrombosis, left [facility])    ED Discharge Orders         Ordered    Eliquis DVT/PE Starter Pack (ELIQUIS STARTER PACK) 5 MG TABS     07/23/18 1206           Oaklan Persons, Wenda Overland, MD 07/23/18 1212    Adriel Desrosier, Wenda Overland, MD 07/23/18 1231

## 2018-07-23 NOTE — ED Notes (Signed)
Left calf pain for past few days unrelieved with medications.  Arrives with concerns of possible clot.

## 2018-07-27 ENCOUNTER — Telehealth: Payer: Self-pay | Admitting: Family Medicine

## 2018-07-27 NOTE — Telephone Encounter (Signed)
Called patient and left vm about appointment on 08/09/2018. Need to change appointment to virtual visit.

## 2018-08-09 ENCOUNTER — Ambulatory Visit (INDEPENDENT_AMBULATORY_CARE_PROVIDER_SITE_OTHER): Payer: Commercial Managed Care - PPO | Admitting: Family Medicine

## 2018-08-09 ENCOUNTER — Encounter: Payer: Self-pay | Admitting: Family Medicine

## 2018-08-09 ENCOUNTER — Ambulatory Visit: Payer: Commercial Managed Care - PPO | Admitting: Family Medicine

## 2018-08-09 VITALS — Ht 73.0 in

## 2018-08-09 DIAGNOSIS — L821 Other seborrheic keratosis: Secondary | ICD-10-CM

## 2018-08-09 DIAGNOSIS — Z86718 Personal history of other venous thrombosis and embolism: Secondary | ICD-10-CM

## 2018-08-09 DIAGNOSIS — I10 Essential (primary) hypertension: Secondary | ICD-10-CM

## 2018-08-09 MED ORDER — AMLODIPINE BESY-BENAZEPRIL HCL 5-20 MG PO CAPS: 1.0000 | ORAL_CAPSULE | Freq: Every morning | ORAL | 2 refills | Status: AC

## 2018-08-09 NOTE — Progress Notes (Addendum)
Established Patient Office Visit  Subjective:  Patient ID: Antonio Murphy, male    DOB: 11-02-1960  Age: 57 y.o. MRN: [id number]  CC:  Chief Complaint  Patient presents with  . Follow-up    HPI Antonio Murphy presents for follow-up of his blood pressure that has been well controlled with the Lotrel.  Tells me that his pressures have been in the 1 teens over 34s.  Patient is having no issues taking the Lotrel.  Serial PSAs have been 0 since his prostatectomy back in June of this past year.  Seen in the emergency room for left calf pain earlier this month.  Venous imaging studies showed what appeared to be a chronic/subacute DVT in the posterior tibial vein.  Significant past medical history of DVT in this area that was provoked 3 years ago.  Treated with 3 months of Eliquis and advised to continue a baby aspirin daily.  Pain in his calf has since resolved.  Past Medical History:  Diagnosis Date  . History of benign colon tumor 2014   S/P  PARTIAL COLECOTMY W/  SPLENECTOMY  . 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 History of small bowel obstruction 2014   POST-OP PARTIAL COLECOTMY -- S/P  LYSIS ADHESIONS  . History of urinary retention 02/2011   SIRS SECONDARY TO UTI/  ACUTE PROSTATITIS  . Hypertension   . Prostate cancer ([place]) UROLOGIST-  DR Alinda Money   dx 08-02-2017-- Stage T1c, Gleason 3+3,  PSA 6.94--  treatment plan prostatectomy  . Wears glasses   . Wears partial dentures    upper    Past Surgical History:  Procedure Laterality Date  . ABDOMINAL SURGERY  2014   LYSIS ADHESIONS FOR SMALL BOWEL OBSTRUCTION  . ESOPHAGOGASTRODUODENOSCOPY  07/2013   GASTROPANCREATIC SHUNT TO DRAIN PANCREATIC PSEUDOCYST  . KNEE ARTHROSCOPY Left 1995  . PARTIAL COLECTOMY  2014   in [place] (BENIGN PER PT)  . PROSTATE BIOPSY  08-02-2017   dr borden office  . ROBOT ASSISTED LAPAROSCOPIC RADICAL PROSTATECTOMY  N/A 09/27/2017   Procedure: XI ROBOTIC ASSISTED LAPAROSCOPIC RADICAL PROSTATECTOMY LEVEL 3;  Surgeon: Raynelle Bring, MD;  Location: [facility];  Service: Urology;  Laterality: N/A;  ONLY NEEDS 210 MIN    Family History  Problem Relation Age of Onset  . Lung cancer Mother   . Aneurysm Father     Social History   Socioeconomic History  . Marital status: Married    Spouse name: Not on file  . Number of children: Not on file  . Years of education: Not on file  . Highest education level: Not on file  Occupational History  . Not on file  Social Needs  . Financial resource strain: Not on file  . Food insecurity    Worry: Not on file    Inability: Not on file  . Transportation needs    Medical: Not on file    Non-medical: Not on file  Tobacco Use  . Smoking status: Never Smoker  . Smokeless tobacco: Never Used  Substance and Sexual Activity  . Alcohol use: Yes    Comment: Ocassionally.  . Drug use: No  . Sexual activity: Yes  Lifestyle  . Physical activity    Days per week: Not on file    Minutes per session: Not on file  . Stress: Not on file  Relationships  . Social  connections    Talks on phone: Not on file    Gets together: Not on file    Attends religious service: Not on file    Active member of club or organization: Not on file    Attends meetings of clubs or organizations: Not on file    Relationship status: Not on file  . Intimate partner violence    Fear of current or ex partner: Not on file    Emotionally abused: Not on file    Physically abused: Not on file    Forced sexual activity: Not on file  Other Topics Concern  . Not on file  Social History Narrative  . Not on file    Outpatient Medications Prior to Visit  Medication Sig Dispense Refill  . amLODipine-benazepril (LOTREL) 5-20 MG capsule Take 1 capsule by mouth every morning. 100 capsule 2  . Eliquis DVT/PE Starter Pack (ELIQUIS STARTER PACK) 5 MG TABS Take as directed on package: start with two-5mg   tablets twice daily for 7 days. On day 8, switch to one-5mg  tablet twice daily. (Patient not taking: Reported on 09/21/2018) 1 each 0   No facility-administered medications prior to visit.     No Known Allergies  ROS Review of Systems  Constitutional: Negative.   HENT: Negative.   Respiratory: Negative.   Cardiovascular: Negative.   Gastrointestinal: Negative.   Musculoskeletal: Negative.   Neurological: Negative.       Objective:    Physical Exam  Constitutional: He is oriented to person, place, and time. He appears well-developed and well-nourished. No distress.  HENT:  Head: Normocephalic and atraumatic.  Right Ear: External ear normal.  Left Ear: External ear normal.  Eyes: Right eye exhibits no discharge. Left eye exhibits no discharge. No scleral icterus.  Neck: No JVD present. No tracheal deviation present.  Pulmonary/Chest: Effort normal. No stridor.  Neurological: He is alert and oriented to person, place, and time.  Skin: He is not diaphoretic.  Psychiatric: He has a normal mood and affect. His behavior is normal.    Ht 6\' 1"  (1.854 m)   BMI 24.41 kg/m  Wt Readings from Last 3 Encounters:  09/21/18 181 lb 6.4 oz (82.3 kg)  07/23/18 185 lb (83.9 kg)  02/08/18 190 lb 4 oz (86.3 kg)     Health Maintenance Due  Topic Date Due  . COLONOSCOPY  04/20/2016    There are no preventive care reminders to display for this patient.  No results found for: TSH Lab Results  Component Value Date   WBC 11.6 (H) 09/21/2018   HGB 14.9 09/21/2018   HCT 44.3 09/21/2018   MCV 93.9 09/21/2018   PLT 298 09/21/2018   Lab Results  Component Value Date   NA 137 09/21/2018   K 3.9 09/21/2018   CO2 26 09/21/2018   GLUCOSE 111 (H) 09/21/2018   BUN 13 09/21/2018   CREATININE 1.01 09/21/2018   BILITOT 1.0 09/21/2018   ALKPHOS 116 09/21/2018   AST 16 09/21/2018   ALT 19 09/21/2018   PROT 7.8 09/21/2018   ALBUMIN 4.5 09/21/2018   CALCIUM 9.7 09/21/2018   ANIONGAP 10  09/21/2018   GFR 69.40 02/16/2018   Lab Results  Component Value Date   CHOL 193 02/16/2018   Lab Results  Component Value Date   HDL 78.30 02/16/2018   Lab Results  Component Value Date   LDLCALC 102 (H) 02/16/2018   Lab Results  Component Value Date   TRIG 64.0 02/16/2018  Lab Results  Component Value Date   CHOLHDL 2 02/16/2018   No results found for: HGBA1C    Assessment & Plan:   Problem List Items Addressed This Visit      Cardiovascular and Mediastinum   Hypertension - Primary   Relevant Medications   amLODipine-benazepril (LOTREL) 5-20 MG capsule   Other Relevant Orders   Basic metabolic panel   CBC     Musculoskeletal and Integument   Seborrheic keratoses   Relevant Orders   Ambulatory referral to Dermatology     Other   History of DVT (deep vein thrombosis)   Relevant Orders   Ambulatory referral to Hematology      Meds ordered this encounter  Medications  . amLODipine-benazepril (LOTREL) 5-20 MG capsule    Sig: Take 1 capsule by mouth every morning.    Dispense:  100 capsule    Refill:  2    Follow-up: Return in about 3 months (around 11/08/2018).    Antonio Murphy, MDVirtual Visit via Video Note  I connected with Antonio Murphy on 10/31/18 at  3:30 PM EDT by a video enabled telemedicine application and verified that I am speaking with the correct person using two identifiers.   I discussed the limitations of evaluation and management by telemedicine and the availability of in person appointments. The patient expressed understanding and agreed to proceed.  History of Present Illness:    Observations/Objective:   Assessment and Plan:   Follow Up Instructions:    I discussed the assessment and treatment plan with the patient. The patient was provided an opportunity to ask questions and all were answered. The patient agreed with the plan and demonstrated an understanding of the instructions.   The patient was advised to  call back or seek an in-person evaluation if the symptoms worsen or if the condition fails to improve as anticipated.  I provided 30 minutes of non-face-to-face time during this encounter.  Patient will follow-up for above ordered blood work and again in 3 months.  Will consider repeating his venous studies to document the stability of what is believed to be a chronic DVT in his left posterior tibial vein.  Hematology consult or advised on chronic coagulation for this patient.  Has no history of documented vascular disease or diabetes.  He does not smoke.  Prostate cancer appears to be resolved.

## 2018-09-20 ENCOUNTER — Other Ambulatory Visit: Payer: Self-pay | Admitting: Family

## 2018-09-20 DIAGNOSIS — I824Y9 Acute embolism and thrombosis of unspecified deep veins of unspecified proximal lower extremity: Secondary | ICD-10-CM

## 2018-09-21 ENCOUNTER — Other Ambulatory Visit: Payer: Self-pay

## 2018-09-21 ENCOUNTER — Telehealth: Payer: Self-pay | Admitting: Family

## 2018-09-21 ENCOUNTER — Inpatient Hospital Stay: Payer: Commercial Managed Care - PPO | Attending: Family | Admitting: Family

## 2018-09-21 ENCOUNTER — Encounter: Payer: Self-pay | Admitting: Family

## 2018-09-21 ENCOUNTER — Inpatient Hospital Stay: Payer: Commercial Managed Care - PPO

## 2018-09-21 VITALS — BP 131/86 | HR 61 | Resp 17 | Ht 73.0 in | Wt 181.4 lb

## 2018-09-21 DIAGNOSIS — Z86718 Personal history of other venous thrombosis and embolism: Secondary | ICD-10-CM | POA: Insufficient documentation

## 2018-09-21 DIAGNOSIS — Z8546 Personal history of malignant neoplasm of prostate: Secondary | ICD-10-CM | POA: Diagnosis not present

## 2018-09-21 DIAGNOSIS — Z801 Family history of malignant neoplasm of trachea, bronchus and lung: Secondary | ICD-10-CM | POA: Diagnosis not present

## 2018-09-21 DIAGNOSIS — I10 Essential (primary) hypertension: Secondary | ICD-10-CM

## 2018-09-21 DIAGNOSIS — I824Y9 Acute embolism and thrombosis of unspecified deep veins of unspecified proximal lower extremity: Secondary | ICD-10-CM

## 2018-09-21 DIAGNOSIS — I82442 Acute embolism and thrombosis of left tibial vein: Secondary | ICD-10-CM | POA: Diagnosis present

## 2018-09-21 LAB — SAVE SMEAR(SSMR), FOR PROVIDER SLIDE REVIEW

## 2018-09-21 LAB — CBC WITH DIFFERENTIAL (CANCER CENTER ONLY)
Abs Immature Granulocytes: 0.04 10*3/uL (ref 0.00–0.07)
Basophils Absolute: 0.1 10*3/uL (ref 0.0–0.1)
Basophils Relative: 1 %
Eosinophils Absolute: 0.1 10*3/uL (ref 0.0–0.5)
Eosinophils Relative: 1 %
HCT: 44.3 % (ref 39.0–52.0)
Hemoglobin: 14.9 g/dL (ref 13.0–17.0)
Immature Granulocytes: 0 %
Lymphocytes Relative: 13 %
Lymphs Abs: 1.5 10*3/uL (ref 0.7–4.0)
MCH: 31.6 pg (ref 26.0–34.0)
MCHC: 33.6 g/dL (ref 30.0–36.0)
MCV: 93.9 fL (ref 80.0–100.0)
Monocytes Absolute: 1.1 10*3/uL — ABNORMAL HIGH (ref 0.1–1.0)
Monocytes Relative: 9 %
Neutro Abs: 8.8 10*3/uL — ABNORMAL HIGH (ref 1.7–7.7)
Neutrophils Relative %: 76 %
Platelet Count: 298 10*3/uL (ref 150–400)
RBC: 4.72 MIL/uL (ref 4.22–5.81)
RDW: 13.5 % (ref 11.5–15.5)
WBC Count: 11.6 10*3/uL — ABNORMAL HIGH (ref 4.0–10.5)
nRBC: 0 % (ref 0.0–0.2)

## 2018-09-21 LAB — CMP (CANCER CENTER ONLY)
ALT: 19 U/L (ref 0–44)
AST: 16 U/L (ref 15–41)
Albumin: 4.5 g/dL (ref 3.5–5.0)
Alkaline Phosphatase: 116 U/L (ref 38–126)
Anion gap: 10 (ref 5–15)
BUN: 13 mg/dL (ref 6–20)
CO2: 26 mmol/L (ref 22–32)
Calcium: 9.7 mg/dL (ref 8.9–10.3)
Chloride: 101 mmol/L (ref 98–111)
Creatinine: 1.01 mg/dL (ref 0.61–1.24)
GFR, Est AFR Am: >60 mL/min (ref >60)
GFR, Estimated: >60 mL/min (ref >60)
Glucose, Bld: 111 mg/dL — ABNORMAL HIGH (ref 70–99)
Potassium: 3.9 mmol/L (ref 3.5–5.1)
Sodium: 137 mmol/L (ref 135–145)
Total Bilirubin: 1 mg/dL (ref 0.3–1.2)
Total Protein: 7.8 g/dL (ref 6.5–8.1)

## 2018-09-21 LAB — ANTITHROMBIN III: AntiThromb III Func: 125 % — ABNORMAL HIGH (ref 75–120)

## 2018-09-21 MED ORDER — RIVAROXABAN 20 MG PO TABS: 20.0000 mg | ORAL_TABLET | Freq: Every day | ORAL | 5 refills | Status: DC

## 2018-09-21 NOTE — Progress Notes (Signed)
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 Antonio Murphy is a very pleasant 58 yo caucasian gentleman with history of DVT in the left leg 3 years ago while living in [place]. He had been travelling by plane and car and had not been hydrating well which he felt may have contributed to the development of the clot. He states that he took Eliquis for 3 months and then stopped and took aspirin 81 mg daily intermittently.  He began pain in his left leg again in April and [place] confirmed that he had a nonocclusive DVT in the left posterior tibial vein, subacute vs. Chronic.  He completed an Eliquis starter pack and then stopped. He is not currently on anticoagulation or aspirin at this time. He calf is tender today.  He has been wearing a compression stocking. He denies history of varicose veins.  No swelling, numbness or tingling in his extremities at this time.  No family history of thrombus that he is aware of. He had an uncle with alcoholic cirrhosis. No other personal or familial history of liver problems.  His father had a congenital brain aneurysm.  His mother passed away at a young age from lung cancer. She was a smoker.  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 He subsequently had these removed from the stomach wall, part of the pancreas was removed along with a portion of small bowel. His spleen was also removed.  Platelet count at this time is 298.  He was treated for prostate cancer last year with prostatectomy. He states that he PSA's since then have been within normal range.  He has had no issue with infections. No fever, chills, n/v, cough,  rash, SOB, chest pain, palpitations, abdominal pain or changes in bowel or bladder habits.  He states that he will occasionally have dizziness when standing from sitting on the floor which he attributes to his BP medication for HTN.  No lymphadenopathy noted on exam.  He has maintained a good appetite and is staying well hydrated. His weight is stable.  He does not smoke and only has the occasional alcoholic beverage socially.  He is active and enjoys exercising. He recently moved to a new home. This kept him quite busy.  He moved here from [place] for work a year and a half ago.   ROS: All other 10 point review of systems is negative.   PAST MEDICAL HISTORY:   Past Medical History:  Diagnosis Date  . History of benign colon tumor 2014   S/P  PARTIAL COLECOTMY W/  SPLENECTOMY  . 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 History of small bowel obstruction 2014   POST-OP PARTIAL COLECOTMY -- S/P  LYSIS ADHESIONS  . History of urinary retention 02/2011   SIRS SECONDARY TO UTI/  ACUTE PROSTATITIS  . Hypertension   . Prostate cancer (Antonio Murphy) UROLOGIST-  DR Alinda Money   dx 08-02-2017-- Stage T1c, Gleason 3+3,  PSA 6.94--  treatment plan prostatectomy  . Wears glasses   . Wears partial dentures    upper  ALLERGIES: No Known Allergies    MEDICATIONS:  Current Outpatient Medications on File Prior to Visit  Medication Sig Dispense Refill  . amLODipine-benazepril (LOTREL) 5-20 MG capsule Take 1 capsule by mouth every morning. 100 capsule 2  . Eliquis DVT/PE Starter Pack (ELIQUIS STARTER PACK) 5 MG TABS Take as directed on package: start with two-5mg  tablets twice daily for 7 days. On day 8, switch to one-5mg  tablet twice daily. 1 each 0   No current facility-administered medications on file prior to visit.      PAST SURGICAL HISTORY Past Surgical History:  Procedure Laterality Date  . ABDOMINAL SURGERY  2014   LYSIS ADHESIONS FOR SMALL  BOWEL OBSTRUCTION  . ESOPHAGOGASTRODUODENOSCOPY  07/2013   GASTROPANCREATIC SHUNT TO DRAIN PANCREATIC PSEUDOCYST  . KNEE ARTHROSCOPY Left 1995  . PARTIAL COLECTOMY  2014   in [place] (BENIGN PER PT)  . PROSTATE BIOPSY  08-02-2017   dr borden office  . ROBOT ASSISTED LAPAROSCOPIC RADICAL PROSTATECTOMY N/A 09/27/2017   Procedure: XI ROBOTIC ASSISTED LAPAROSCOPIC RADICAL PROSTATECTOMY LEVEL 3;  Surgeon: Antonio Bring, MD;  Location: [facility];  Service: Urology;  Laterality: N/A;  ONLY NEEDS 210 MIN    FAMILY HISTORY: Family History  Problem Relation Age of Onset  . Lung cancer Mother   . Aneurysm Father     SOCIAL HISTORY:  reports that he has never smoked. He has never used smokeless tobacco. He reports current alcohol use. He reports that he does not use drugs.  PERFORMANCE STATUS: The patient's performance status is 1 - Symptomatic but completely ambulatory  PHYSICAL EXAM: Most Recent Vital Signs: There were no vitals taken for this visit. There were no vitals taken for this visit.  General Appearance:    Alert, cooperative, no distress, appears stated age  Head:    Normocephalic, without obvious abnormality, atraumatic  Eyes:    PERRL, conjunctiva/corneas clear, EOM's intact, fundi    benign, both eyes             Throat:   Lips, mucosa, and tongue normal; teeth and gums normal  Neck:   Supple, symmetrical, trachea midline, no adenopathy;       thyroid:  No enlargement/tenderness/nodules; no carotid   bruit or JVD  Back:     Symmetric, no curvature, ROM normal, no CVA tenderness  Lungs:     Clear to auscultation bilaterally, respirations unlabored  Chest wall:    No tenderness or deformity  Heart:    Regular rate and rhythm, S1 and S2 normal, no murmur, rub   or gallop  Abdomen:     Soft, non-tender, bowel sounds active all four quadrants,    no masses, no organomegaly        Extremities:   Extremities normal, atraumatic, no cyanosis  or edema  Pulses:   2+ and symmetric all extremities  Skin:   Skin color, texture, turgor normal, no rashes or lesions  Lymph nodes:   Cervical, supraclavicular, and axillary nodes normal  Neurologic:   CNII-XII intact. Normal strength, sensation and reflexes      throughout    LABORATORY DATA:  No results found for this or any previous visit (from the past 48 hour(s)).    RADIOGRAPHY: No results found.     PATHOLOGY: None   ASSESSMENT/PLAN: Mr. Knerr is a very pleasant 58 yo caucasian gentleman with history of DVT in the left leg 3 years and now a questionable recurrent vs.  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   Hypercoagulable panel results pending. Had some difficulty with specimens clotting when collected and had to be collected twice.   We will get him started on Xarelto 20 mg PO daily for 6 months and then change to maintenance 10 mg PO daily for 1 year.  We will repeat an [place] in 3 months.  Follow-up in 6 weeks.   All questions were answered and he is in agreement with the plan. He will contact our office with any questions or concerns. We can certainly see him sooner if needed.   He was discussed with and also seen by Dr. Marin Olp and he is in agreement with the aforementioned.   Laverna Peace, NP    Addendum: I saw and examined Mr. Grounds with Judson Roch.  I agree with the above assessment.  I really think that we need to get him on anticoagulation.  There is might be a new thrombus.  Unfortunately, we really do not have the old Dopplers from [place] for comparison.  The Dopplers I did have back in April show that the thrombus in the left posterior tibial vein is subacute or chronic.  I think that he is symptomatic.  I do think that anticoagulation would be reasonable for him.  I think he would be a very low risk of bleeding.  I would like to repeat a Doppler in about 3 months so we can see how this vein looks and see how the thrombus looks.  A  compression stocking certainly would not be a bad idea.  I think he might have 1.  We will have to see what the hypercoagulable studies look like.  It is conceivable that he may have an underlying hypercoagulable issue.  It is incredibly fun talking to him.  He is from [place].  There was like to talk about regarding [place].  I think that we should have him on therapeutic anticoagulation for 6 months and then maintenance anticoagulation for 6 months.  I think this would be very appropriate and adequate for the thrombus in the left leg.  We will get him back here in 6 weeks and see how he is doing with the Xarelto.  We spent about 50 minutes with him.  We spent time talking to him.  We answered all of his questions.  We counseled him.  We will make follow-up appointments for him.   Lattie Haw, MD

## 2018-09-21 NOTE — Telephone Encounter (Signed)
Called and SWP about appointments date/time per 6/3 los

## 2018-09-22 LAB — PROTEIN C ACTIVITY: Protein C Activity: 102 % (ref 73–180)

## 2018-09-22 LAB — PROTEIN S ACTIVITY: Protein S Activity: 120 % (ref 63–140)

## 2018-09-22 LAB — LACTATE DEHYDROGENASE: LDH: 366 U/L — ABNORMAL HIGH (ref 98–192)

## 2018-09-22 LAB — LUPUS ANTICOAGULANT PANEL
DRVVT: 44.9 s (ref 0.0–47.0)
PTT Lupus Anticoagulant: 37.7 s (ref 0.0–51.9)

## 2018-09-22 LAB — HOMOCYSTEINE: Homocysteine: 15.7 umol/L — ABNORMAL HIGH (ref 0.0–14.5)

## 2018-09-22 LAB — PROTEIN C, TOTAL: Protein C, Total: 82 % (ref 60–150)

## 2018-09-22 LAB — PROTEIN S, TOTAL: Protein S Ag, Total: 138 % (ref 60–150)

## 2018-09-23 LAB — CARDIOLIPIN ANTIBODIES, IGG, IGM, IGA
Anticardiolipin IgA: <9 APL U/mL (ref 0–11)
Anticardiolipin IgG: <9 GPL U/mL (ref 0–14)
Anticardiolipin IgM: <9 MPL U/mL (ref 0–12)

## 2018-09-23 LAB — BETA-2-GLYCOPROTEIN I ABS, IGG/M/A
Beta-2 Glyco I IgG: <9 GPI IgG units (ref 0–20)
Beta-2-Glycoprotein I IgA: <9 GPI IgA units (ref 0–25)
Beta-2-Glycoprotein I IgM: <9 GPI IgM units (ref 0–32)

## 2018-09-26 LAB — PROTHROMBIN GENE MUTATION

## 2018-09-26 LAB — FACTOR 5 LEIDEN

## 2018-09-29 ENCOUNTER — Telehealth: Payer: Self-pay | Admitting: *Deleted

## 2018-09-29 ENCOUNTER — Other Ambulatory Visit: Payer: Self-pay | Admitting: Family

## 2018-09-29 DIAGNOSIS — E7211 Homocystinuria: Secondary | ICD-10-CM

## 2018-09-29 MED ORDER — FOLIC ACID 1 MG PO TABS: 1.0000 mg | ORAL_TABLET | Freq: Every day | ORAL | 6 refills | Status: DC

## 2018-09-29 MED ORDER — FOLIC ACID 1 MG PO TABS: 1.0000 mg | ORAL_TABLET | Freq: Every day | ORAL | 6 refills | Status: AC

## 2018-09-29 NOTE — Telephone Encounter (Signed)
Called pt advised Folic Acid rx sent to pt pharmacy, [place] will be done at next office visit.Pt verbalized understanding.

## 2018-10-14 ENCOUNTER — Telehealth: Payer: Self-pay

## 2018-10-14 ENCOUNTER — Other Ambulatory Visit: Payer: Self-pay

## 2018-10-14 MED ORDER — APIXABAN 5 MG PO TABS: 5.0000 mg | ORAL_TABLET | Freq: Two times a day (BID) | ORAL | 3 refills | Status: DC

## 2018-10-14 NOTE — Telephone Encounter (Signed)
Received call from pt reporting that since beginning Xarelto he has experienced back pain and muscle spasms. Upon researching drug, pt found this to be a side effect of Xarelto. Pt states he has taken Eliquis in the past without negative effect. Questions if he could switch back to Eliquis.  Per verbal order, Dr Marin Olp ok with pt taking Eliquis INSTEAD of Xarelto. Pt verbalizes understanding of how to take Eliquis and to d/c Xarelto. New script sent to pt's confirmed pharmacy. dph

## 2018-10-31 ENCOUNTER — Telehealth: Payer: Self-pay

## 2018-10-31 NOTE — Addendum Note (Signed)
Addended by: Jon Billings on: 10/31/2018 01:22 PM   Modules accepted: Orders

## 2018-10-31 NOTE — Telephone Encounter (Signed)
Copied from [place] [id number]. Topic: Referral - Request for Referral >> Oct 31, 2018 11:57 AM Nils Flack wrote: Has patient seen PCP for this complaint? Yes.   *If NO, is insurance requiring patient see PCP for this issue before PCP can refer them? Referral for which specialty: dermatology Preferred provider/office: [facility] or whoever Dr Ethelene Hal thinks is best  Reason for referral: pt had referral in Oct 2019 but never went, he would like to go now

## 2018-11-02 ENCOUNTER — Inpatient Hospital Stay (HOSPITAL_BASED_OUTPATIENT_CLINIC_OR_DEPARTMENT_OTHER): Payer: Commercial Managed Care - PPO | Admitting: Family

## 2018-11-02 ENCOUNTER — Encounter: Payer: Self-pay | Admitting: Family

## 2018-11-02 ENCOUNTER — Inpatient Hospital Stay: Payer: Commercial Managed Care - PPO | Attending: Family

## 2018-11-02 ENCOUNTER — Other Ambulatory Visit: Payer: Self-pay

## 2018-11-02 VITALS — BP 125/89 | HR 76 | Temp 99.2°F | Resp 18 | Ht 73.0 in | Wt 175.8 lb

## 2018-11-02 DIAGNOSIS — M545 Low back pain: Secondary | ICD-10-CM

## 2018-11-02 DIAGNOSIS — I824Y9 Acute embolism and thrombosis of unspecified deep veins of unspecified proximal lower extremity: Secondary | ICD-10-CM

## 2018-11-02 DIAGNOSIS — Z7901 Long term (current) use of anticoagulants: Secondary | ICD-10-CM | POA: Diagnosis not present

## 2018-11-02 DIAGNOSIS — I82442 Acute embolism and thrombosis of left tibial vein: Secondary | ICD-10-CM

## 2018-11-02 DIAGNOSIS — Z86718 Personal history of other venous thrombosis and embolism: Secondary | ICD-10-CM

## 2018-11-02 LAB — CMP (CANCER CENTER ONLY)
ALT: 21 U/L (ref 0–44)
AST: 26 U/L (ref 15–41)
Albumin: 4.3 g/dL (ref 3.5–5.0)
Alkaline Phosphatase: 257 U/L — ABNORMAL HIGH (ref 38–126)
Anion gap: 9 (ref 5–15)
BUN: 16 mg/dL (ref 6–20)
CO2: 28 mmol/L (ref 22–32)
Calcium: 9.5 mg/dL (ref 8.9–10.3)
Chloride: 100 mmol/L (ref 98–111)
Creatinine: 0.99 mg/dL (ref 0.61–1.24)
GFR, Est AFR Am: >60 mL/min (ref >60)
GFR, Estimated: >60 mL/min (ref >60)
Glucose, Bld: 100 mg/dL — ABNORMAL HIGH (ref 70–99)
Potassium: 4.7 mmol/L (ref 3.5–5.1)
Sodium: 137 mmol/L (ref 135–145)
Total Bilirubin: 0.8 mg/dL (ref 0.3–1.2)
Total Protein: 7.4 g/dL (ref 6.5–8.1)

## 2018-11-02 LAB — CBC WITH DIFFERENTIAL (CANCER CENTER ONLY)
Abs Immature Granulocytes: 0.09 10*3/uL — ABNORMAL HIGH (ref 0.00–0.07)
Basophils Absolute: 0.1 10*3/uL (ref 0.0–0.1)
Basophils Relative: 1 %
Eosinophils Absolute: 0.1 10*3/uL (ref 0.0–0.5)
Eosinophils Relative: 1 %
HCT: 41.1 % (ref 39.0–52.0)
Hemoglobin: 13.4 g/dL (ref 13.0–17.0)
Immature Granulocytes: 1 %
Lymphocytes Relative: 15 %
Lymphs Abs: 1.8 10*3/uL (ref 0.7–4.0)
MCH: 29.9 pg (ref 26.0–34.0)
MCHC: 32.6 g/dL (ref 30.0–36.0)
MCV: 91.7 fL (ref 80.0–100.0)
Monocytes Absolute: 1.1 10*3/uL — ABNORMAL HIGH (ref 0.1–1.0)
Monocytes Relative: 9 %
Neutro Abs: 9.5 10*3/uL — ABNORMAL HIGH (ref 1.7–7.7)
Neutrophils Relative %: 73 %
Platelet Count: 370 10*3/uL (ref 150–400)
RBC: 4.48 MIL/uL (ref 4.22–5.81)
RDW: 13.4 % (ref 11.5–15.5)
WBC Count: 12.6 10*3/uL — ABNORMAL HIGH (ref 4.0–10.5)
nRBC: 0 % (ref 0.0–0.2)

## 2018-11-02 NOTE — Progress Notes (Signed)
Hematology and Oncology Follow Up Visit  Teryl Mcconaghy [id number] [birth year] 58 y.o. 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 Fowle is here today for follow-up. He is having a hard time with back pain and aching in the left forearm with numbness in the wrist. He associates this with starting 4 days or so after start Xarelto and then continued to increase once he changed to Xarelto. He states that he would like to hold the Xarelto for 3 days and restart to see if his symptoms resolve or improve.  No issues with bleeding. He has had some mild bruising. No petechiae.  No fever, chills, n/v, cough, rash, dizziness, SOB, chest pain, palpitations, abdominal pain or changes in bowel or bladder habits. No numbness or tingling in his extremities.  He states that he does not have an appetite but is hydrating. His weight is down 6 lbs since his last visit.   ECOG Performance Status: 1 - Symptomatic but completely ambulatory  Medications:  Allergies as of 11/02/2018   No Known Allergies     Medication List       Accurate as of November 02, 2018 11:50 AM. If you have any questions, ask your nurse or doctor.        amLODipine-benazepril 5-20 MG capsule Commonly known as: LOTREL Take 1 capsule by mouth every morning.   apixaban 5 MG Tabs tablet Commonly known as: Eliquis Take 1 tablet (5 mg total) by mouth 2 (two) times daily.   folic acid 1 MG tablet Commonly known as: FOLVITE Take 1 tablet (1 mg total) by mouth daily.       Allergies: No Known Allergies  Past Medical History, Surgical history, Social history, and Family History were reviewed and updated.  Review of Systems: All other 10 point review of systems is negative.   Physical Exam:  vitals were not taken for this visit.   Wt Readings from Last 3 Encounters:  09/21/18 181 lb  6.4 oz (82.3 kg)  07/23/18 185 lb (83.9 kg)  02/08/18 190 lb 4 oz (86.3 kg)    Ocular: Sclerae unicteric, pupils equal, round and reactive to light Ear-nose-throat: Oropharynx clear, dentition fair Lymphatic: No cervical or supraclavicular adenopathy Lungs no rales or rhonchi, good excursion bilaterally Heart regular rate and rhythm, no murmur appreciated Abd soft, nontender, positive bowel sounds, no liver or spleen tip palpated on exam, no fluid wave  MSK no focal spinal tenderness, no joint edema Neuro: non-focal, well-oriented, appropriate affect Breasts: Deferred   Lab Results  Component Value Date   WBC 12.6 (H) 11/02/2018   HGB 13.4 11/02/2018   HCT 41.1 11/02/2018   MCV 91.7 11/02/2018   PLT 370 11/02/2018   No results found for: FERRITIN, IRON, TIBC, UIBC, IRONPCTSAT Lab Results  Component Value Date   RBC 4.48 11/02/2018   No results found for: KPAFRELGTCHN, LAMBDASER, KAPLAMBRATIO No results found for: IGGSERUM, IGA, IGMSERUM No results found for: Kathrynn Ducking, MSPIKE, SPEI   Chemistry      Component Value Date/Time   NA 137 09/21/2018 1103   K 3.9 09/21/2018 1103   CL 101 09/21/2018 1103   CO2 26 09/21/2018 1103   BUN 13 09/21/2018 1103   CREATININE 1.01 09/21/2018 1103      Component Value Date/Time   CALCIUM 9.7  09/21/2018 1103   ALKPHOS 116 09/21/2018 1103   AST 16 09/21/2018 1103   ALT 19 09/21/2018 1103   BILITOT 1.0 09/21/2018 1103       Impression and Plan: Mr. Concannon is a 58 yo caucasian gentleman with history of DVT in the left leg several years ago and now questionable acute vs. chronic DVT of the left leg.  He has had issues tolerating both Xarelto and Eliquis due to back pain and arm aching. I spoke with Dr. Marin Olp and he is ok with him hold ing the [facility] for 3 days and then restarting. Hopefully this will help. He will let us know if this does not work and we will change him to Pradaxa.   We  will repeat an US of the left leg and see him back in 6 weeks.  He will contact our office with any questions or concerns. We can certainly see him sooner if needed.   Laverna Peace, NP 7/15/202011:50 AM

## 2018-11-04 ENCOUNTER — Telehealth: Payer: Self-pay | Admitting: Hematology & Oncology

## 2018-11-04 ENCOUNTER — Telehealth: Payer: Self-pay | Admitting: *Deleted

## 2018-11-04 NOTE — Telephone Encounter (Signed)
Called and spoke with patient regarding appointments added per 7/15 los ,  Patient also asked to speak w/ Nurse regarding medications and pains he is having.  IB message sent to Charlsie Merles, RN.  Per Burman Freestone she has spoken with him yesterday regarding these issues and he was aware of what he needs to do.

## 2018-11-04 NOTE — Telephone Encounter (Signed)
Returned patient's phone call regarding questions about Eliquis and pain. Left message on cell phone to call the office back.

## 2018-11-07 ENCOUNTER — Other Ambulatory Visit: Payer: Self-pay | Admitting: Family

## 2018-11-07 DIAGNOSIS — I824Y9 Acute embolism and thrombosis of unspecified deep veins of unspecified proximal lower extremity: Secondary | ICD-10-CM

## 2018-11-07 NOTE — Progress Notes (Signed)
Patient still having pain in his leg and bruise on bottom of foot. Feels shaky. Would like an [place] this week to re-evaluate his leg and then discuss further treatment.

## 2018-11-08 ENCOUNTER — Ambulatory Visit (HOSPITAL_BASED_OUTPATIENT_CLINIC_OR_DEPARTMENT_OTHER)
Admission: RE | Admit: 2018-11-08 | Discharge: 2018-11-08 | Disposition: A | Discharge status: Home / Self Care | Payer: Commercial Managed Care - PPO | Source: Ambulatory Visit | Attending: Family | Admitting: Family

## 2018-11-08 ENCOUNTER — Other Ambulatory Visit: Payer: Self-pay

## 2018-11-08 DIAGNOSIS — I824Y9 Acute embolism and thrombosis of unspecified deep veins of unspecified proximal lower extremity: Secondary | ICD-10-CM | POA: Insufficient documentation

## 2018-11-09 ENCOUNTER — Telehealth: Payer: Self-pay | Admitting: Hematology & Oncology

## 2018-11-09 ENCOUNTER — Telehealth: Payer: Self-pay | Admitting: Family

## 2018-11-09 NOTE — Telephone Encounter (Signed)
I spoke with Antonio Murphy and went over his [place] with him in detail. This showed a chronic non occlusive thrombus of the left posterior tibial vein. With all the complications he has had on both Xarelto and Eliquis, we will now have him transition to 2 enteric coated baby aspirin daily with food. He is happy with this change and will contact our office with any questions or concerns. He will also wear his compression stocking for added support when on his feet for an extended period of time.

## 2018-11-09 NOTE — Telephone Encounter (Signed)
Called and advised patient that appointments had been rescheduled for 9/28 per 7/22 sch msg.  IB message sent to [facility] to cancel [place] also

## 2018-12-02 ENCOUNTER — Telehealth: Payer: Self-pay

## 2018-12-02 ENCOUNTER — Other Ambulatory Visit: Payer: Self-pay

## 2018-12-02 ENCOUNTER — Ambulatory Visit (INDEPENDENT_AMBULATORY_CARE_PROVIDER_SITE_OTHER): Payer: Commercial Managed Care - PPO

## 2018-12-02 ENCOUNTER — Ambulatory Visit (INDEPENDENT_AMBULATORY_CARE_PROVIDER_SITE_OTHER): Payer: Commercial Managed Care - PPO | Admitting: Family Medicine

## 2018-12-02 ENCOUNTER — Encounter: Payer: Self-pay | Admitting: Family Medicine

## 2018-12-02 VITALS — BP 120/76 | HR 92 | Ht 73.0 in | Wt 176.5 lb

## 2018-12-02 DIAGNOSIS — F418 Other specified anxiety disorders: Secondary | ICD-10-CM

## 2018-12-02 DIAGNOSIS — S39012A Strain of muscle, fascia and tendon of lower back, initial encounter: Secondary | ICD-10-CM

## 2018-12-02 DIAGNOSIS — R160 Hepatomegaly, not elsewhere classified: Secondary | ICD-10-CM | POA: Diagnosis not present

## 2018-12-02 DIAGNOSIS — Z8546 Personal history of malignant neoplasm of prostate: Secondary | ICD-10-CM | POA: Diagnosis not present

## 2018-12-02 DIAGNOSIS — R918 Other nonspecific abnormal finding of lung field: Secondary | ICD-10-CM | POA: Diagnosis not present

## 2018-12-02 DIAGNOSIS — R0789 Other chest pain: Secondary | ICD-10-CM | POA: Insufficient documentation

## 2018-12-02 DIAGNOSIS — R0781 Pleurodynia: Secondary | ICD-10-CM | POA: Diagnosis not present

## 2018-12-02 DIAGNOSIS — R634 Abnormal weight loss: Secondary | ICD-10-CM

## 2018-12-02 MED ORDER — METHOCARBAMOL 500 MG PO TABS: 500.0000 mg | ORAL_TABLET | Freq: Three times a day (TID) | ORAL | 0 refills | Status: DC | PRN

## 2018-12-02 MED ORDER — MELOXICAM 15 MG PO TABS: 15.0000 mg | ORAL_TABLET | Freq: Every day | ORAL | 0 refills | Status: DC

## 2018-12-02 MED ORDER — PAROXETINE HCL 10 MG PO TABS: ORAL_TABLET | ORAL | 0 refills | Status: DC

## 2018-12-02 MED ORDER — MELOXICAM 15 MG PO TABS: 15.0000 mg | ORAL_TABLET | Freq: Every day | ORAL | 0 refills | Status: AC

## 2018-12-02 NOTE — Progress Notes (Addendum)
Established Patient Office Visit  Subjective:  Patient ID: Antonio Murphy, male    DOB: 1960/09/12  Age: 58 y.o. MRN: [id number]  CC:  Chief Complaint  Patient presents with  . Leg Swelling  . Back Pain  . Arm Pain    HPI Antonio Murphy presents for a 54-month history of intermittent lower back pain.  The pain is described as bilateral and has been moving up his back towards his rib cage.  He denies radiation down into the legs paresthesias or weakness in his legs.  He has felt intermittent muscle spasms with this.  There is been no injury.  He had engaged in some heavy lifting moving into a new house just prior to this.  He does not have a strenuous job.  He has been treating this with lower back exercises with some improvement.  Status post prostatectomy in 2018 for stage T1c Gleason 3+3 prostate cancer.  Follow-up PSAs have been 0.  Last PSA was in March of this year.  Due for colonoscopy.  Stooling patterns have been normal for him.  There is been no blood in the stool.  Patient continues to work through the pandemic.  Things are stressful.  His wife is living in [place].  He will be joining her out there at some point.  He has also experienced tenderness to palpation in his right inferior rib cage and his left anterior rib cage.  Also seeing oncology for DVT.  Patient does not smoke or use illicit drugs.  He drinks socially but has not done so in quite some time because his wife is in [place]. Has lost 10 lbs with some sweating at night. No pale stools.   Past Medical History:  Diagnosis Date  . History of benign colon tumor 2014   S/P  PARTIAL COLECOTMY W/  SPLENECTOMY  . 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 History of small bowel obstruction 2014   POST-OP PARTIAL COLECOTMY -- S/P  LYSIS ADHESIONS  . History of urinary retention 02/2011   SIRS SECONDARY TO UTI/  ACUTE PROSTATITIS  . Hypertension   . Prostate cancer ([place])  UROLOGIST-  DR Alinda Money   dx 08-02-2017-- Stage T1c, Gleason 3+3,  PSA 6.94--  treatment plan prostatectomy  . Wears glasses   . Wears partial dentures    upper    Past Surgical History:  Procedure Laterality Date  . ABDOMINAL SURGERY  2014   LYSIS ADHESIONS FOR SMALL BOWEL OBSTRUCTION  . ESOPHAGOGASTRODUODENOSCOPY  07/2013   GASTROPANCREATIC SHUNT TO DRAIN PANCREATIC PSEUDOCYST  . KNEE ARTHROSCOPY Left 1995  . PARTIAL COLECTOMY  2014   in [place] (BENIGN PER PT)  . PROSTATE BIOPSY  08-02-2017   dr borden office  . ROBOT ASSISTED LAPAROSCOPIC RADICAL PROSTATECTOMY N/A 09/27/2017   Procedure: XI ROBOTIC ASSISTED LAPAROSCOPIC RADICAL PROSTATECTOMY LEVEL 3;  Surgeon: Raynelle Bring, MD;  Location: [facility];  Service: Urology;  Laterality: N/A;  ONLY NEEDS 210 MIN    Family History  Problem Relation Age of Onset  . Lung cancer Mother   . Aneurysm Father     Social History   Socioeconomic History  . Marital status: Married    Spouse name: Not on file  . Number of children: Not on file  . Years of education: Not on file  . Highest education level: Not on file  Occupational History  .  Not on file  Social Needs  . Financial resource strain: Not on file  . Food insecurity    Worry: Not on file    Inability: Not on file  . Transportation needs    Medical: Not on file    Non-medical: Not on file  Tobacco Use  . Smoking status: Never Smoker  . Smokeless tobacco: Never Used  Substance and Sexual Activity  . Alcohol use: Yes    Comment: Ocassionally.  . Drug use: No  . Sexual activity: Yes  Lifestyle  . Physical activity    Days per week: Not on file    Minutes per session: Not on file  . Stress: Not on file  Relationships  . Social Herbalist on phone: Not on file    Gets together: Not on file    Attends religious service: Not on file    Active member of club or organization: Not on file    Attends meetings of clubs or  organizations: Not on file    Relationship status: Not on file  . Intimate partner violence    Fear of current or ex partner: Not on file    Emotionally abused: Not on file    Physically abused: Not on file    Forced sexual activity: Not on file  Other Topics Concern  . Not on file  Social History Narrative  . Not on file    Outpatient Medications Prior to Visit  Medication Sig Dispense Refill  . amLODipine-benazepril (LOTREL) 5-20 MG capsule Take 1 capsule by mouth every morning. 147 capsule 2  . folic acid (FOLVITE) 1 MG tablet Take 1 tablet (1 mg total) by mouth daily. 30 tablet 6   No facility-administered medications prior to visit.     No Known Allergies  ROS Review of Systems  Constitutional: Positive for appetite change. Negative for activity change, chills, fatigue and fever.  HENT: Negative.   Eyes: Negative for photophobia and visual disturbance.  Respiratory: Negative.   Cardiovascular: Negative.   Gastrointestinal: Negative.   Endocrine: Negative for polyphagia and polyuria.  Genitourinary: Negative for difficulty urinating and frequency.  Musculoskeletal: Positive for back pain and myalgias. Negative for gait problem and joint swelling.  Skin: Negative for pallor and rash.  Neurological: Negative for weakness.  Hematological: Does not bruise/bleed easily.  Psychiatric/Behavioral: Positive for dysphoric mood. The patient is nervous/anxious.      . Depression screen [facility] 2/9 12/02/2018 09/06/2017  Decreased Interest 2 0  Down, Depressed, Hopeless 1 0  PHQ - 2 Score 3 0  Altered sleeping 3 -  Tired, decreased energy 3 -  Change in appetite 3 -  Feeling bad or failure about yourself  0 -  Trouble concentrating 1 -  Moving slowly or fidgety/restless 0 -  Suicidal thoughts 0 -  PHQ-9 Score 13 -    Objective:    Physical Exam  Constitutional: He is oriented to person, place, and time. He appears well-developed and well-nourished. No distress.  HENT:   Head: Normocephalic and atraumatic.  Right Ear: External ear normal.  Left Ear: External ear normal.  Mouth/Throat: Oropharynx is clear and moist. No oropharyngeal exudate.  Eyes: Pupils are equal, round, and reactive to light. Conjunctivae are normal. Right eye exhibits no discharge. Left eye exhibits no discharge. No scleral icterus.  Neck: Neck supple. No JVD present. No tracheal deviation present.  Pulmonary/Chest: Effort normal. No stridor.        Musculoskeletal:  Lumbar back: He exhibits decreased range of motion, tenderness and bony tenderness.       Back:  Neurological: He is alert and oriented to person, place, and time. He has normal strength.  Reflex Scores:      Patellar reflexes are 0 on the right side and 1+ on the left side.      Achilles reflexes are 0 on the right side and 1+ on the left side. Negative dural tension.   Skin: Skin is warm and dry. He is not diaphoretic.  Psychiatric: He has a normal mood and affect. His behavior is normal.    BP 120/76   Pulse 92   Ht 6\' 1"  (1.854 m)   Wt 176 lb 8 oz (80.1 kg)   SpO2 96%   BMI 23.29 kg/m  Wt Readings from Last 3 Encounters:  12/02/18 176 lb 8 oz (80.1 kg)  11/02/18 175 lb 12.8 oz (79.7 kg)  09/21/18 181 lb 6.4 oz (82.3 kg)   BP Readings from Last 3 Encounters:  12/02/18 120/76  11/02/18 125/89  09/21/18 131/86   Guideline developer:  UpToDate (see UpToDate for funding source) Date Released: June 2014  Health Maintenance Due  Topic Date Due  . COLONOSCOPY  04/20/2016  . INFLUENZA VACCINE  11/19/2018    There are no preventive care reminders to display for this patient.  No results found for: TSH Lab Results  Component Value Date   WBC 12.6 (H) 11/02/2018   HGB 13.4 11/02/2018   HCT 41.1 11/02/2018   MCV 91.7 11/02/2018   PLT 370 11/02/2018   Lab Results  Component Value Date   NA 137 11/02/2018   K 4.7 11/02/2018   CO2 28 11/02/2018   GLUCOSE 100 (H) 11/02/2018   BUN 16  11/02/2018   CREATININE 0.99 11/02/2018   BILITOT 0.8 11/02/2018   ALKPHOS 257 (H) 11/02/2018   AST 26 11/02/2018   ALT 21 11/02/2018   PROT 7.4 11/02/2018   ALBUMIN 4.3 11/02/2018   CALCIUM 9.5 11/02/2018   ANIONGAP 9 11/02/2018   GFR 69.40 02/16/2018   Lab Results  Component Value Date   CHOL 193 02/16/2018   Lab Results  Component Value Date   HDL 78.30 02/16/2018   Lab Results  Component Value Date   LDLCALC 102 (H) 02/16/2018   Lab Results  Component Value Date   TRIG 64.0 02/16/2018   Lab Results  Component Value Date   CHOLHDL 2 02/16/2018   No results found for: HGBA1C    Assessment & Plan:   Problem List Items Addressed This Visit      Musculoskeletal and Integument   Strain of lumbar region - Primary   Relevant Medications   methocarbamol (ROBAXIN) 500 MG tablet   meloxicam (MOBIC) 15 MG tablet   Other Relevant Orders   DG Lumbar Spine Complete (Completed)     Other   Rib pain on right side   Relevant Orders   DG Ribs Unilateral Right (Completed)   Depression with anxiety   Relevant Medications   PARoxetine (PAXIL) 10 MG tablet   Abnormal findings on diagnostic imaging of lung   Relevant Orders   CT Chest Wo Contrast (Completed)   CT Abdomen Pelvis W Contrast (Completed)   Lung mass   Relevant Orders   CT Chest Wo Contrast (Completed)   CT Abdomen Pelvis W Contrast (Completed)   Mass of multiple sites of liver   Relevant Orders   Hepatic function panel  Basic metabolic panel   CBC   Urinalysis, Routine w reflex microscopic   Gamma GT   CT Abdomen Pelvis W Contrast (Completed)   Ambulatory referral to Oncology   History of prostate cancer   Relevant Orders   PSA   Ambulatory referral to Oncology   Abnormal weight loss   Relevant Orders   CT Abdomen Pelvis W Contrast (Completed)      Meds ordered this encounter  Medications  . DISCONTD: meloxicam (MOBIC) 15 MG tablet    Sig: Take 1 tablet (15 mg total) by mouth daily.     Dispense:  30 tablet    Refill:  0  . methocarbamol (ROBAXIN) 500 MG tablet    Sig: Take 1 tablet (500 mg total) by mouth every 8 (eight) hours as needed for muscle spasms.    Dispense:  50 tablet    Refill:  0  . PARoxetine (PAXIL) 10 MG tablet    Sig: Take one daily for one week and then increase to 2 daily.    Dispense:  60 tablet    Refill:  0  . meloxicam (MOBIC) 15 MG tablet    Sig: Take 1 tablet (15 mg total) by mouth daily. For 10 days and then as needed.    Dispense:  30 tablet    Refill:  0    Follow-up: Return in about 1 month (around 01/02/2019).    I spent a total of 55 minutes with this patient with 50% of the time spent in counseling.  May consider sports medicine referral.  Also offered counseling therapy.  Patient is having no other bony pain that he is aware of at this point.  LFTs are normal.  8/17 addendum: 6 mm nodule noted in the right upper lung.  Discussed with patient.  Follow-up CT scan ordered.

## 2018-12-02 NOTE — Telephone Encounter (Signed)
Copied from [place] [id number]. Topic: General - Other >> Dec 02, 2018  3:03 PM Rainey Pines A wrote: Patient would like a callback with nurse in regards to his medications that were prescribed today. Patients feels they will interact with the aspirin regimen he is already on

## 2018-12-02 NOTE — Telephone Encounter (Signed)
I spoke with Dr. Ethelene Hal. He said that the medications are fine to take with his aspirin. I spoke with pt and made him aware. Pt mentioned a lump up near his collar bone, but we will wait on the results from his x-rays.

## 2018-12-05 ENCOUNTER — Telehealth: Payer: Self-pay | Admitting: Family Medicine

## 2018-12-05 DIAGNOSIS — R918 Other nonspecific abnormal finding of lung field: Secondary | ICD-10-CM | POA: Insufficient documentation

## 2018-12-05 NOTE — Telephone Encounter (Signed)
Received a call from [facility] of Mr. Cothron x-ray result that was done on 12/02/2018 stating possible 6 mm oval nodule on right lung--recommend CT scan.   Dr. Ethelene Hal please advise.

## 2018-12-05 NOTE — Addendum Note (Signed)
Addended by: Abelino Derrick A on: 12/05/2018 11:10 AM   Modules accepted: Orders

## 2018-12-05 NOTE — Telephone Encounter (Signed)
Received call from [place] at [facility] to report an abnormal finding on Left unilateral ribs x-ray.  Called Claudie Fisherman to report - spoke with Laurey Arrow at [phone] who took report and states she will let Dr. Ethelene Hal know.

## 2018-12-12 ENCOUNTER — Telehealth: Payer: Self-pay

## 2018-12-12 ENCOUNTER — Ambulatory Visit
Admission: RE | Admit: 2018-12-12 | Discharge: 2018-12-12 | Disposition: A | Discharge status: Home / Self Care | Payer: Commercial Managed Care - PPO | Source: Ambulatory Visit | Attending: Family Medicine | Admitting: Family Medicine

## 2018-12-12 ENCOUNTER — Other Ambulatory Visit (INDEPENDENT_AMBULATORY_CARE_PROVIDER_SITE_OTHER): Payer: Commercial Managed Care - PPO

## 2018-12-12 ENCOUNTER — Other Ambulatory Visit: Payer: Self-pay

## 2018-12-12 DIAGNOSIS — R160 Hepatomegaly, not elsewhere classified: Secondary | ICD-10-CM

## 2018-12-12 DIAGNOSIS — R634 Abnormal weight loss: Secondary | ICD-10-CM | POA: Insufficient documentation

## 2018-12-12 DIAGNOSIS — Z8546 Personal history of malignant neoplasm of prostate: Secondary | ICD-10-CM

## 2018-12-12 DIAGNOSIS — R918 Other nonspecific abnormal finding of lung field: Secondary | ICD-10-CM

## 2018-12-12 LAB — BASIC METABOLIC PANEL
BUN: 22 mg/dL (ref 6–23)
CO2: 26 mEq/L (ref 19–32)
Calcium: 9.3 mg/dL (ref 8.4–10.5)
Chloride: 96 mEq/L (ref 96–112)
Creatinine, Ser: 0.78 mg/dL (ref 0.40–1.50)
GFR: 101.91 mL/min (ref >60.00)
Glucose, Bld: 99 mg/dL (ref 70–99)
Potassium: 4.8 mEq/L (ref 3.5–5.1)
Sodium: 131 mEq/L — ABNORMAL LOW (ref 135–145)

## 2018-12-12 LAB — HEPATIC FUNCTION PANEL
ALT: 24 U/L (ref 0–53)
AST: 35 U/L (ref 0–37)
Albumin: 3.7 g/dL (ref 3.5–5.2)
Alkaline Phosphatase: 479 U/L — ABNORMAL HIGH (ref 39–117)
Bilirubin, Direct: 0.1 mg/dL (ref 0.0–0.3)
Total Bilirubin: 0.7 mg/dL (ref 0.2–1.2)
Total Protein: 7 g/dL (ref 6.0–8.3)

## 2018-12-12 LAB — CBC
HCT: 30.8 % — ABNORMAL LOW (ref 39.0–52.0)
Hemoglobin: 9.9 g/dL — ABNORMAL LOW (ref 13.0–17.0)
MCHC: 32.2 g/dL (ref 30.0–36.0)
MCV: 88.6 fl (ref 78.0–100.0)
Platelets: 371 10*3/uL (ref 150.0–400.0)
RBC: 3.47 Mil/uL — ABNORMAL LOW (ref 4.22–5.81)
RDW: 15.4 % (ref 11.5–15.5)
WBC: 17.9 10*3/uL — ABNORMAL HIGH (ref 4.0–10.5)

## 2018-12-12 LAB — GAMMA GT: GGT: 220 U/L — ABNORMAL HIGH (ref 7–51)

## 2018-12-12 MED ORDER — IOPAMIDOL (ISOVUE-300) INJECTION 61%
100.0000 mL | Freq: Once | INTRAVENOUS | Status: AC | PRN
  Administered 2018-12-12: 100 mL via INTRAVENOUS

## 2018-12-12 NOTE — Addendum Note (Signed)
Addended by: Jon Billings on: 12/12/2018 05:26 PM   Modules accepted: Orders

## 2018-12-12 NOTE — Addendum Note (Signed)
Addended by: Jon Billings on: 12/12/2018 10:06 AM   Modules accepted: Orders

## 2018-12-12 NOTE — Telephone Encounter (Signed)
Copied from [place] [id number]. Topic: General - Other >> Dec 12, 2018  4:24 PM Leward Quan A wrote: Reason for CRM: patient called to request a call back from Dr Ethelene Hal stated that he did a stat test today with imaging and is curious of what the results are. Patient would like a call back with or without the results please Ph# [phone]

## 2018-12-13 LAB — URINALYSIS, ROUTINE W REFLEX MICROSCOPIC
Bilirubin Urine: NEGATIVE
Hgb urine dipstick: NEGATIVE
Ketones, ur: NEGATIVE
Leukocytes,Ua: NEGATIVE
Nitrite: NEGATIVE
RBC / HPF: NONE SEEN (ref 0–?)
Specific Gravity, Urine: 1.02 (ref 1.000–1.030)
Total Protein, Urine: NEGATIVE
Urine Glucose: NEGATIVE
Urobilinogen, UA: 1 (ref 0.0–1.0)
pH: 6 (ref 5.0–8.0)

## 2018-12-13 LAB — PSA: PSA: 0.01 ng/mL — ABNORMAL LOW (ref 0.10–4.00)

## 2018-12-14 ENCOUNTER — Telehealth: Payer: Self-pay

## 2018-12-14 DIAGNOSIS — G4700 Insomnia, unspecified: Secondary | ICD-10-CM

## 2018-12-14 MED ORDER — TRAZODONE HCL 50 MG PO TABS: 25.0000 mg | ORAL_TABLET | Freq: Every evening | ORAL | 3 refills | Status: DC | PRN

## 2018-12-14 MED FILL — traZODone HCL 50 MG TABS: 50 | 30 days supply | Qty: 30 | Fill #0

## 2018-12-14 NOTE — Telephone Encounter (Signed)
Copied from [place] [id number]. Topic: General - Inquiry >> Dec 14, 2018  7:22 AM Rayann Heman wrote: Reason for CRM: pt called and stated that he talked to Dr Ethelene Hal yesterday and would like to ask him more questions. Please advise

## 2018-12-14 NOTE — Addendum Note (Signed)
Addended by: Jon Billings on: 12/14/2018 09:21 AM   Modules accepted: Orders

## 2018-12-15 ENCOUNTER — Other Ambulatory Visit (HOSPITAL_BASED_OUTPATIENT_CLINIC_OR_DEPARTMENT_OTHER): Payer: Commercial Managed Care - PPO

## 2018-12-15 ENCOUNTER — Other Ambulatory Visit: Payer: Commercial Managed Care - PPO

## 2018-12-15 ENCOUNTER — Ambulatory Visit: Payer: Commercial Managed Care - PPO | Admitting: Hematology & Oncology

## 2018-12-15 MED ORDER — TRAZODONE HCL 50 MG PO TABS: 25.0000 mg | ORAL_TABLET | Freq: Every evening | ORAL | 3 refills | Status: AC | PRN

## 2018-12-15 NOTE — Telephone Encounter (Signed)
Rx resent.

## 2018-12-15 NOTE — Telephone Encounter (Signed)
Pt called and stated that traZODone (DESYREL) 50 MG tablet EX:[id number] was sent to the wrong pharmacy and would like it sent to   [facility] [place], [place] AT [address] [phone] (Phone) [phone] (Fax)

## 2018-12-15 NOTE — Addendum Note (Signed)
Addended by: Rodrigo Ran on: 12/15/2018 07:49 AM   Modules accepted: Orders

## 2018-12-16 ENCOUNTER — Inpatient Hospital Stay: Payer: Commercial Managed Care - PPO | Attending: Family | Admitting: Oncology

## 2018-12-16 ENCOUNTER — Other Ambulatory Visit: Payer: Self-pay

## 2018-12-16 VITALS — BP 122/89 | HR 79 | Temp 98.0°F | Resp 18 | Ht 73.0 in | Wt 173.3 lb

## 2018-12-16 DIAGNOSIS — R160 Hepatomegaly, not elsewhere classified: Secondary | ICD-10-CM

## 2018-12-16 DIAGNOSIS — Z801 Family history of malignant neoplasm of trachea, bronchus and lung: Secondary | ICD-10-CM

## 2018-12-16 DIAGNOSIS — C801 Malignant (primary) neoplasm, unspecified: Secondary | ICD-10-CM

## 2018-12-16 DIAGNOSIS — C61 Malignant neoplasm of prostate: Secondary | ICD-10-CM

## 2018-12-16 DIAGNOSIS — M549 Dorsalgia, unspecified: Secondary | ICD-10-CM

## 2018-12-16 DIAGNOSIS — R748 Abnormal levels of other serum enzymes: Secondary | ICD-10-CM

## 2018-12-16 DIAGNOSIS — C7951 Secondary malignant neoplasm of bone: Secondary | ICD-10-CM

## 2018-12-16 DIAGNOSIS — D649 Anemia, unspecified: Secondary | ICD-10-CM

## 2018-12-16 DIAGNOSIS — C787 Secondary malignant neoplasm of liver and intrahepatic bile duct: Secondary | ICD-10-CM

## 2018-12-16 NOTE — Progress Notes (Signed)
Reason for the request:    Hepatic masses  HPI: I was asked by Dr. Ethelene Hal to evaluate Antonio Murphy for new hepatic lesions.  He is 58 year old man with a history of prostate cancer diagnosed in 2019.  He presented with a PSA of 6.94 and found to have a Gleason score 3+3 = 6.  He underwent radical prostatectomy completed by Dr. Alinda Money on September 27, 2017.  A final pathology showed an acinar prostate adenocarcinoma with a Gleason score 3+3 = 6 with the tumor confined into the organ indicating T2 disease.  All margins were negative.  His most recent PSA on December 12, 2018 was 0.01.  He was diagnosed with deep vein thrombosis and 2017 after multiple flights and appears to be provoked at that time.  He was treated with anticoagulation for 3 months.  He started developing calf pain and ultrasound Doppler of his lower extremity showed chronic deep vein thrombosis.  He was started initially on Xarelto and subsequently switched to Eliquis.  He continues to have pain predominantly in his lower back as well as the hips and started to have symptoms of increased fatigue and tiredness and had close to 15 pound weight loss.  He was evaluated by his primary care physician and radiographic images of the lumbar spine and ribs were obtained on 12/02/2018.  X-rays of the spine showed a mild wedging of L1 and deformity of T12 with degenerative changes.  Rib x-ray showed no fractures but a questionable pulmonary nodule was detected.  CT scan of the chest obtained on 12/12/2018 showed no abnormalities in the lung but is diffuse sclerotic lesions noted predominantly at T6.  There is also a liver mass that was not completely imaged.  CT scan of the abdomen showed continued diffuse metastatic disease involving the thoracolumbar spine pelvis as well as the proximal left femur.  He has also numerous hepatic metastasis identified with the largest mass measuring 15.7 x 10.3 and 13.9 cm.  Clinically, he denied any hematochezia, melena or  hemoptysis.  He does report some fatigue tiredness and pain for which he takes meloxicam that has been ineffective.  He denies any frequency urgency hesitancy.  His PSA was also 0.01  He does not report any headaches, blurry vision, syncope or seizures. Does not report any fevers, chills or sweats.  Does not report any cough, wheezing or hemoptysis.  Does not report any chest pain, palpitation, orthopnea or leg edema.  Does not report any nausea, vomiting or abdominal pain.  Does not report any constipation or diarrhea.  Does not report any skeletal complaints.    Does not report frequency, urgency or hematuria.  Does not report any skin rashes or lesions. Does not report any heat or cold intolerance.  Does not report any lymphadenopathy or petechiae.  Does not report any anxiety or depression.  Remaining review of systems is negative.    Past Medical History:  Diagnosis Date  . History of benign colon tumor 2014   S/P  PARTIAL COLECOTMY W/  SPLENECTOMY  . 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 History of small bowel obstruction 2014   POST-OP PARTIAL COLECOTMY -- S/P  LYSIS ADHESIONS  . History of urinary retention 02/2011   SIRS SECONDARY TO UTI/  ACUTE PROSTATITIS  . Hypertension   . Prostate cancer ([place]) UROLOGIST-  DR Alinda Money   dx 08-02-2017-- Stage T1c, Gleason 3+3,  PSA 6.94--  treatment  plan prostatectomy  . Wears glasses   . Wears partial dentures    upper  :  Past Surgical History:  Procedure Laterality Date  . ABDOMINAL SURGERY  2014   LYSIS ADHESIONS FOR SMALL BOWEL OBSTRUCTION  . ESOPHAGOGASTRODUODENOSCOPY  07/2013   GASTROPANCREATIC SHUNT TO DRAIN PANCREATIC PSEUDOCYST  . KNEE ARTHROSCOPY Left 1995  . PARTIAL COLECTOMY  2014   in [place] (BENIGN PER PT)  . PROSTATE BIOPSY  08-02-2017   dr borden office  . ROBOT ASSISTED LAPAROSCOPIC RADICAL PROSTATECTOMY N/A 09/27/2017   Procedure:  XI ROBOTIC ASSISTED LAPAROSCOPIC RADICAL PROSTATECTOMY LEVEL 3;  Surgeon: Antonio Bring, MD;  Location: [facility];  Service: Urology;  Laterality: N/A;  ONLY NEEDS 210 MIN  :   Current Outpatient Medications:  .  amLODipine-benazepril (LOTREL) 5-20 MG capsule, Take 1 capsule by mouth every morning., Disp: 100 capsule, Rfl: 2 .  folic acid (FOLVITE) 1 MG tablet, Take 1 tablet (1 mg total) by mouth daily., Disp: 30 tablet, Rfl: 6 .  meloxicam (MOBIC) 15 MG tablet, Take 1 tablet (15 mg total) by mouth daily. For 10 days and then as needed., Disp: 30 tablet, Rfl: 0 .  methocarbamol (ROBAXIN) 500 MG tablet, Take 1 tablet (500 mg total) by mouth every 8 (eight) hours as needed for muscle spasms., Disp: 50 tablet, Rfl: 0 .  PARoxetine (PAXIL) 10 MG tablet, Take one daily for one week and then increase to 2 daily., Disp: 60 tablet, Rfl: 0 .  traZODone (DESYREL) 50 MG tablet, Take 0.5-1 tablets (25-50 mg total) by mouth at bedtime as needed for sleep., Disp: 30 tablet, Rfl: 3:  No Known Allergies:  Family History  Problem Relation Age of Onset  . Lung cancer Mother   . Aneurysm Father   :  Social History   Socioeconomic History  . Marital status: Married    Spouse name: Not on file  . Number of children: Not on file  . Years of education: Not on file  . Highest education level: Not on file  Occupational History  . Not on file  Social Needs  . Financial resource strain: Not on file  . Food insecurity    Worry: Not on file    Inability: Not on file  . Transportation needs    Medical: Not on file    Non-medical: Not on file  Tobacco Use  . Smoking status: Never Smoker  . Smokeless tobacco: Never Used  Substance and Sexual Activity  . Alcohol use: Yes    Comment: Ocassionally.  . Drug use: No  . Sexual activity: Yes  Lifestyle  . Physical activity    Days per week: Not on file    Minutes per session: Not on file  . Stress: Not on file  Relationships  . Social Product manager on phone: Not on file    Gets together: Not on file    Attends religious service: Not on file    Active member of club or organization: Not on file    Attends meetings of clubs or organizations: Not on file    Relationship status: Not on file  . Intimate partner violence    Fear of current or ex partner: Not on file    Emotionally abused: Not on file    Physically abused: Not on file    Forced sexual activity: Not on file  Other Topics Concern  . Not on file  Social History Narrative  . Not on file  :  Pertinent items are noted in HPI.  Exam: Blood pressure 122/89, pulse 79, temperature 98 F (36.7 C), temperature source Oral, resp. rate 18, height 6\' 1"  (1.854 m), weight 173 lb 4.8 oz (78.6 kg), SpO2 99 %.  ECOG 1  General appearance: alert and cooperative appeared without distress. Head: atraumatic without any abnormalities. Eyes: conjunctivae/corneas clear. PERRL.  Sclera anicteric. Throat: lips, mucosa, and tongue normal; without oral thrush or ulcers. Resp: clear to auscultation bilaterally without rhonchi, wheezes or dullness to percussion. Cardio: regular rate and rhythm, S1, S2 normal, no murmur, click, rub or gallop GI: soft, non-tender; bowel sounds normal; no masses.  Hepatomegaly noted. Skin: Skin color, texture, turgor normal. No rashes or lesions Lymph nodes: Cervical, supraclavicular, and axillary nodes normal. Neurologic: Grossly normal without any motor, sensory or deep tendon reflexes. Musculoskeletal: No joint deformity or effusion.  CBC    Component Value Date/Time   WBC 17.9 (H) 12/12/2018 1156   RBC 3.47 (L) 12/12/2018 1156   HGB 9.9 (L) 12/12/2018 1156   HGB 13.4 11/02/2018 1126   HCT 30.8 (L) 12/12/2018 1156   PLT 371.0 12/12/2018 1156   PLT 370 11/02/2018 1126   MCV 88.6 12/12/2018 1156   MCH 29.9 11/02/2018 1126   MCHC 32.2 12/12/2018 1156   RDW 15.4 12/12/2018 1156   LYMPHSABS 1.8 11/02/2018 1126   MONOABS 1.1 (H) 11/02/2018 1126    EOSABS 0.1 11/02/2018 1126   BASOSABS 0.1 11/02/2018 1126     Chemistry      Component Value Date/Time   NA 131 (L) 12/12/2018 1156   K 4.8 12/12/2018 1156   CL 96 12/12/2018 1156   CO2 26 12/12/2018 1156   BUN 22 12/12/2018 1156   CREATININE 0.78 12/12/2018 1156   CREATININE 0.99 11/02/2018 1126      Component Value Date/Time   CALCIUM 9.3 12/12/2018 1156   ALKPHOS 479 (H) 12/12/2018 1156   AST 35 12/12/2018 1156   AST 26 11/02/2018 1126   ALT 24 12/12/2018 1156   ALT 21 11/02/2018 1126   BILITOT 0.7 12/12/2018 1156   BILITOT 0.8 11/02/2018 1126       Dg Ribs Unilateral Left  Result Date: 12/02/2018 CLINICAL DATA:  Rib pain EXAM: LEFT RIBS - 2 VIEW COMPARISON:  None. FINDINGS: Possible 6 mm oval nodule in the right lung apex. Normal heart size. No pneumothorax. Left rib series demonstrates no acute displaced fracture. Postsurgical changes in the left upper quadrant. IMPRESSION: 1. No acute osseous abnormality 2. Possible 6 mm nodule in the right lung apex. Consider correlation with CT These results will be called to the ordering clinician or representative by the Radiologist Assistant, and communication documented in the PACS or zVision Dashboard. Electronically Signed   By: Donavan Foil M.D.   On: 12/02/2018 21:15   Dg Ribs Unilateral Right  Result Date: 12/02/2018 CLINICAL DATA:  Rib tenderness EXAM: RIGHT RIBS - 2 VIEW COMPARISON:  None. FINDINGS: 6 mm ovoid nodule in the right upper lobe. No acute displaced right rib fracture. No focal osseous abnormality. IMPRESSION: No acute osseous abnormality Electronically Signed   By: Donavan Foil M.D.   On: 12/02/2018 21:16   Dg Lumbar Spine Complete  Result Date: 12/02/2018 CLINICAL DATA:  Lumbar sprain EXAM: LUMBAR SPINE - COMPLETE 4+ VIEW COMPARISON:  None. FINDINGS: Lumbar alignment is normal. Corticated ossific opacity superior, anterior corner of L3 felt consistent with limbus vertebra. Mild wedging of  L1. Mild superior  endplate deformity 624THL. mild degenerative changes at L1-L2 and L2-L3. IMPRESSION: 1. Mild wedging of L1 and mild superior endplate deformity of 624THL of uncertain chronicity 2. Mild degenerative changes Electronically Signed   By: Donavan Foil M.D.   On: 12/02/2018 21:21   Ct Chest Wo Contrast  Result Date: 12/12/2018 CLINICAL DATA:  Rib films demonstrating possible right upper lobe pulmonary nodule. EXAM: CT CHEST WITHOUT CONTRAST TECHNIQUE: Multidetector CT imaging of the chest was performed following the standard protocol without IV contrast. COMPARISON:  12/02/2018 rib films. FINDINGS: Cardiovascular: Aortic atherosclerosis. Tortuous thoracic aorta. Normal heart size, without pericardial effusion. Lad coronary artery calcification. Mediastinum/Nodes: No supraclavicular adenopathy. No mediastinal or definite hilar adenopathy, given limitations of unenhanced CT. Lungs/Pleura: No pleural fluid. No CT correlate for the question plain film abnormality within the lungs. Bibasilar scarring. A 3 mm right upper lobe pulmonary nodule on 31/100. Mild nodularity along the right minor fissure, including at 4 mm on 62/3. Somewhat ill-defined 3 mm right lower lobe pulmonary nodule on 90/3 Upper Abdomen: Infiltrative tumor throughout the liver, including within the majority of the anterior segment right and medial segment left liver lobes. Example at on the order of 14.1 x 11.6 cm on 150/2. Mild caudate and lateral segment left liver lobe prominence. Splenectomy. Normal imaged portions of the stomach, pancreas, adrenal glands, kidneys. Musculoskeletal: Relatively diffuse sclerotic lesions, consistent with metastatic disease. Example within the anterior aspect of the T6 vertebral body at 1.3 cm on sagittal image 84. IMPRESSION: 1. No pulmonary parenchymal correlate within the right upper lobe. Scattered right-sided pulmonary nodules are nonspecific. 2. Diffuse sclerotic lesions, which given history of prostate cancer, likely  represent metastatic disease. 3. Incompletely and suboptimally imaged dominant liver mass or masses. Favor metastatic disease. Differential considerations include hepatocellular carcinoma in the setting of cirrhosis. Consider dedicated abdominopelvic contrast enhanced CT for complete staging. These results will be called to the ordering clinician or representative by the Radiologist Assistant, and communication documented in the PACS or zVision Dashboard. For the plain film abnormality Electronically Signed   By: Abigail Miyamoto M.D.   On: 12/12/2018 08:39   Ct Abdomen Pelvis W Contrast  Result Date: 12/12/2018 CLINICAL DATA:  Unintended weight loss, prostate cancer, prior splenectomy and colon resection, abnormal CT chest today with question liver mass EXAM: CT ABDOMEN AND PELVIS WITH CONTRAST TECHNIQUE: Multidetector CT imaging of the abdomen and pelvis was performed using the standard protocol following bolus administration of intravenous contrast. CONTRAST:  122mL ISOVUE-300 IOPAMIDOL (ISOVUE-300) INJECTION 61% COMPARISON:  CT chest 12/12/2018 FINDINGS: Lower chest: Minimal bibasilar atelectasis Hepatobiliary: Numerous hepatic masses identified consistent with hepatic metastatic disease. Many of the observed lesions are small but a large confluent mass is identified involving the medial segment LEFT lobe as well as RIGHT lobe, 15.7 x 10.3 x 13.9 cm. Gallbladder unremarkable. Minimal infiltration of perihepatic tissue planes. Pancreas: Normal appearance Spleen: Surgically absent Adrenals/Urinary Tract: Adrenal glands normal appearance. Kidneys, ureters and bladder normal appearance Stomach/Bowel: Appendix not definitely visualized. Stomach and bowel loops unremarkable. Vascular/Lymphatic: Mild atherosclerotic calcifications in the iliac arteries. Tortuosity of iliac arteries and aorta. Aorta normal caliber. No definite adenopathy Reproductive: Prostate gland surgically absent. Other: Small amount of free fluid.  No free air. No hernia. Small focal fluid collection identified adjacent to the stomach likely related to prior splenectomy, 3.0 x 1.3 cm image 18. Musculoskeletal: Scattered sclerotic osseous metastases involving the thoracolumbar spine, pelvis, and proximal LEFT femur. IMPRESSION: Osseous metastatic disease, favor due  to prostate cancer history. Extensive hepatic metastatic disease including a large mass 15.7 cm greatest size. Small amount of nonspecific free pelvic fluid. Small focal fluid collection at splenic bed adjacent to pancreas likely postsurgical, 3.0 x 1.3 cm. Electronically Signed   By: Lavonia Dana M.D.   On: 12/12/2018 14:12    Assessment and Plan:     58 year old man with:  1.  Metastatic malignancy noted in August 2020.  He presented with diffuse hepatic metastasis as well as bone disease involving the thoracolumbar spine as well as the femur.  He does have increased alkaline phosphatase and a PSA that is undetectable.  The natural course of this disease as well as the differential diagnosis was discussed today in detail with the patient face-to-face and with his family via phone.  Given his a low-grade prostate cancer as well as low PSA it is unlikely that we are dealing with metastatic prostate cancer at this time.  Poorly differentiated variant of prostate cancer such as small cell GU cancer with metastatic disease to the liver and the bone appears more likely.  Other consideration would include colon cancer given his previous GI tumor surgery in 2014 versus cancer of unknown primary and other solid tumor malignancy.  Primary hepatocellular carcinoma considered less likely.  The management of such disease was discussed today in detail.  Occluding logistics of obtaining tissue biopsy, complete the staging work-up as well as treatment options were reviewed.  It is likely that we are dealing with advanced malignancy that is incurable and any treatment at this time would be palliative.   These options would include systemic chemotherapy, immunotherapy or combination of both.  Radiation therapy might M play a role for palliating his bony disease.  After discussion today, we have elected to proceed with obtaining tissue biopsy of the liver as well as obtaining MRI of the spine to fully quantify his bone disease.  After obtaining a tissue biopsy we will coordinate his follow-up as well as treatment options.  His family is predominantly in [place] and might be inclined to get treated care depending on his wishes.  2.  Prostate cancer: Status post prostatectomy with a low Gleason score and undetectable PSA.  This is unlikely to be contributing to his illness.  3.  Prognosis and goals of care: This was discussed today in details.  This is likely incurable and any treatment at this time would be palliative.  His prognosis is dependent on the pathology as well as his response to treatment.  His performance status is excellent and aggressive measures are warranted.  4.  Anemia: Most likely related to chronic disease and malignancy.  We have discussed strategies to boost his nutritional intake with supplements which might help his overall weight loss but not so much his anemia.  He does not require any transfusion.  5.  Follow-up: Will be determined following the results of his biopsy.  80  minutes was spent with the patient face-to-face today.  More than 50% of time was dedicated to reviewing his imaging studies, laboratory data, differential diagnosis, treatment options and prognosis and discussing logistics of future treatment with him and his family.     Thank you for the referral. A copy of this consult has been forwarded to the requesting physician.

## 2018-12-20 ENCOUNTER — Encounter: Payer: Self-pay | Admitting: Oncology

## 2018-12-21 ENCOUNTER — Other Ambulatory Visit: Payer: Self-pay | Admitting: Student

## 2018-12-21 ENCOUNTER — Other Ambulatory Visit: Payer: Self-pay | Admitting: Radiology

## 2018-12-22 ENCOUNTER — Ambulatory Visit (HOSPITAL_COMMUNITY)
Admission: RE | Admit: 2018-12-22 | Discharge: 2018-12-22 | Disposition: A | Discharge status: Home / Self Care | Payer: Commercial Managed Care - PPO | Source: Ambulatory Visit | Attending: Oncology | Admitting: Oncology

## 2018-12-22 ENCOUNTER — Other Ambulatory Visit: Payer: Self-pay

## 2018-12-22 DIAGNOSIS — R160 Hepatomegaly, not elsewhere classified: Secondary | ICD-10-CM

## 2018-12-22 DIAGNOSIS — Z79899 Other long term (current) drug therapy: Secondary | ICD-10-CM | POA: Insufficient documentation

## 2018-12-22 DIAGNOSIS — Z9049 Acquired absence of other specified parts of digestive tract: Secondary | ICD-10-CM | POA: Insufficient documentation

## 2018-12-22 DIAGNOSIS — Z86718 Personal history of other venous thrombosis and embolism: Secondary | ICD-10-CM | POA: Insufficient documentation

## 2018-12-22 DIAGNOSIS — I10 Essential (primary) hypertension: Secondary | ICD-10-CM | POA: Diagnosis not present

## 2018-12-22 DIAGNOSIS — Z8546 Personal history of malignant neoplasm of prostate: Secondary | ICD-10-CM | POA: Diagnosis not present

## 2018-12-22 DIAGNOSIS — Z9081 Acquired absence of spleen: Secondary | ICD-10-CM | POA: Insufficient documentation

## 2018-12-22 DIAGNOSIS — Z7982 Long term (current) use of aspirin: Secondary | ICD-10-CM | POA: Diagnosis not present

## 2018-12-22 DIAGNOSIS — C229 Malignant neoplasm of liver, not specified as primary or secondary: Secondary | ICD-10-CM | POA: Insufficient documentation

## 2018-12-22 DIAGNOSIS — Z9079 Acquired absence of other genital organ(s): Secondary | ICD-10-CM | POA: Diagnosis not present

## 2018-12-22 DIAGNOSIS — Z8249 Family history of ischemic heart disease and other diseases of the circulatory system: Secondary | ICD-10-CM | POA: Insufficient documentation

## 2018-12-22 DIAGNOSIS — Z801 Family history of malignant neoplasm of trachea, bronchus and lung: Secondary | ICD-10-CM | POA: Insufficient documentation

## 2018-12-22 LAB — CBC WITH DIFFERENTIAL/PLATELET
Abs Immature Granulocytes: 0.65 10*3/uL — ABNORMAL HIGH (ref 0.00–0.07)
Basophils Absolute: 0.1 10*3/uL (ref 0.0–0.1)
Basophils Relative: 1 %
Eosinophils Absolute: 0.1 10*3/uL (ref 0.0–0.5)
Eosinophils Relative: 0 %
HCT: 30 % — ABNORMAL LOW (ref 39.0–52.0)
Hemoglobin: 9.7 g/dL — ABNORMAL LOW (ref 13.0–17.0)
Immature Granulocytes: 4 %
Lymphocytes Relative: 11 %
Lymphs Abs: 1.7 10*3/uL (ref 0.7–4.0)
MCH: 29 pg (ref 26.0–34.0)
MCHC: 32.3 g/dL (ref 30.0–36.0)
MCV: 89.8 fL (ref 80.0–100.0)
Monocytes Absolute: 1.2 10*3/uL — ABNORMAL HIGH (ref 0.1–1.0)
Monocytes Relative: 8 %
Neutro Abs: 12.5 10*3/uL — ABNORMAL HIGH (ref 1.7–7.7)
Neutrophils Relative %: 76 %
Platelets: 257 10*3/uL (ref 150–400)
RBC: 3.34 MIL/uL — ABNORMAL LOW (ref 4.22–5.81)
RDW: 18.3 % — ABNORMAL HIGH (ref 11.5–15.5)
WBC: 16.3 10*3/uL — ABNORMAL HIGH (ref 4.0–10.5)
nRBC: 1.2 % — ABNORMAL HIGH (ref 0.0–0.2)

## 2018-12-22 LAB — PROTIME-INR
INR: 1.2 (ref 0.8–1.2)
Prothrombin Time: 15.3 seconds — ABNORMAL HIGH (ref 11.4–15.2)

## 2018-12-22 LAB — COMPREHENSIVE METABOLIC PANEL
ALT: 42 U/L (ref 0–44)
AST: 59 U/L — ABNORMAL HIGH (ref 15–41)
Albumin: 3.1 g/dL — ABNORMAL LOW (ref 3.5–5.0)
Alkaline Phosphatase: 596 U/L — ABNORMAL HIGH (ref 38–126)
Anion gap: 15 (ref 5–15)
BUN: 20 mg/dL (ref 6–20)
CO2: 22 mmol/L (ref 22–32)
Calcium: 9.1 mg/dL (ref 8.9–10.3)
Chloride: 96 mmol/L — ABNORMAL LOW (ref 98–111)
Creatinine, Ser: 0.89 mg/dL (ref 0.61–1.24)
GFR calc Af Amer: >60 mL/min (ref >60)
GFR calc non Af Amer: >60 mL/min (ref >60)
Glucose, Bld: 123 mg/dL — ABNORMAL HIGH (ref 70–99)
Potassium: 4.5 mmol/L (ref 3.5–5.1)
Sodium: 133 mmol/L — ABNORMAL LOW (ref 135–145)
Total Bilirubin: 1.4 mg/dL — ABNORMAL HIGH (ref 0.3–1.2)
Total Protein: 7.1 g/dL (ref 6.5–8.1)

## 2018-12-22 MED ORDER — MIDAZOLAM HCL 2 MG/2ML IJ SOLN
INTRAMUSCULAR | Status: AC | PRN
  Administered 2018-12-22 (×2): 0.5 mg via INTRAVENOUS
  Administered 2018-12-22: 1 mg via INTRAVENOUS

## 2018-12-22 MED ORDER — LIDOCAINE-EPINEPHRINE 1 %-1:100000 IJ SOLN
INTRAMUSCULAR | Status: AC
  Filled 2018-12-22: qty 1

## 2018-12-22 MED ORDER — GELATIN ABSORBABLE 12-7 MM EX MISC
CUTANEOUS | Status: AC
  Filled 2018-12-22: qty 1

## 2018-12-22 MED ORDER — MIDAZOLAM HCL 2 MG/2ML IJ SOLN
INTRAMUSCULAR | Status: AC
  Filled 2018-12-22: qty 2

## 2018-12-22 MED ORDER — SODIUM CHLORIDE 0.9 % IV SOLN
INTRAVENOUS | Status: DC
  Administered 2018-12-22: 12:00:00 via INTRAVENOUS

## 2018-12-22 MED ORDER — FENTANYL CITRATE (PF) 100 MCG/2ML IJ SOLN
INTRAMUSCULAR | Status: AC | PRN
  Administered 2018-12-22: 50 ug via INTRAVENOUS
  Administered 2018-12-22 (×2): 25 ug via INTRAVENOUS

## 2018-12-22 MED ORDER — FENTANYL CITRATE (PF) 100 MCG/2ML IJ SOLN
INTRAMUSCULAR | Status: AC
  Filled 2018-12-22: qty 2

## 2018-12-22 NOTE — Discharge Instructions (Addendum)
Liver Biopsy, Care After  These instructions give you information on caring for yourself after your procedure. Your doctor may also give you more specific instructions. Call your doctor if you have any problems or questions after your procedure.  What can I expect after the procedure?  After the procedure, it is common to have:  · Pain and soreness where the biopsy was done.  · Bruising around the area where the biopsy was done.  · Sleepiness and be tired for a few days.  Follow these instructions at home:  Medicines  · Take over-the-counter and prescription medicines only as told by your doctor.  · If you were prescribed an antibiotic medicine, take it as told by your doctor. Do not stop taking the antibiotic even if you start to feel better.  · Do not take medicines such as aspirin and ibuprofen. These medicines can thin your blood. Do not take these medicines unless your doctor tells you to take them.  · If you are taking prescription pain medicine, take actions to prevent or treat constipation. Your doctor may recommend that you:  ? Drink enough fluid to keep your pee (urine) clear or pale yellow.  ? Take over-the-counter or prescription medicines.  ? Eat foods that are high in fiber, such as fresh fruits and vegetables, whole grains, and beans.  ? Limit foods that are high in fat and processed sugars, such as fried and sweet foods.  Caring for your cut  · Follow instructions from your doctor about how to take care of your cuts from surgery (incisions). Make sure you:  ? Wash your hands with soap and water before you change your bandage (dressing). If you cannot use soap and water, use hand sanitizer.  ? Change your bandage as told by your doctor.  ? Leave stitches (sutures), skin glue, or skin tape (adhesive) strips in place. They may need to stay in place for 2 weeks or longer. If tape strips get loose and curl up, you may trim the loose edges. Do not remove tape strips completely unless your doctor says it is  okay.  · Check your cuts every day for signs of infection. Check for:  ? Redness, swelling, or more pain.  ? Fluid or blood.  ? Pus or a bad smell.  ? Warmth.  · Do not take baths, swim, or use a hot tub until your doctor says it is okay to do so.  Activity    · Rest at home for 1-2 days or as told by your doctor.  ? Avoid sitting for a long time without moving. Get up to take short walks every 1-2 hours.  · Return to your normal activities as told by your doctor. Ask what activities are safe for you.  · Do not do these things in the first 24 hours:  ? Drive.  ? Use machinery.  ? Take a bath or shower.  · Do not lift more than 10 pounds (4.5 kg) or play contact sports for the first 2 weeks.  General instructions    · Do not drink alcohol in the first week after the procedure.  · Have someone stay with you for at least 24 hours after the procedure.  · Get your test results. Ask your doctor or the department that is doing the test:  ? When will my results be ready?  ? How will I get my results?  ? What are my treatment options?  ? What other tests do   I need?  ? What are my next steps?  · Keep all follow-up visits as told by your doctor. This is important.  Contact a doctor if:  · A cut bleeds and leaves more than just a small spot of blood.  · A cut is red, puffs up (swells), or hurts more than before.  · Fluid or something else comes from a cut.  · A cut smells bad.  · You have a fever or chills.  Get help right away if:  · You have swelling, bloating, or pain in your belly (abdomen).  · You get dizzy or faint.  · You have a rash.  · You feel sick to your stomach (nauseous) or throw up (vomit).  · You have trouble breathing, feel short of breath, or feel faint.  · Your chest hurts.  · You have problems talking or seeing.  · You have trouble with your balance or moving your arms or legs.  Summary  · After the procedure, it is common to have pain, soreness, bruising, and tiredness.  · Your doctor will tell you how to  take care of yourself at home. Change your bandage, take your medicines, and limit your activities as told by your doctor.  · Call your doctor if you have symptoms of infection. Get help right away if your belly swells, your cut bleeds a lot, or you have trouble talking or breathing.  This information is not intended to replace advice given to you by your health care provider. Make sure you discuss any questions you have with your health care provider.  Document Released: 01/14/2008 Document Revised: 04/16/2017 Document Reviewed: 04/16/2017  Elsevier Patient Education © 2020 [employer]

## 2018-12-22 NOTE — Sedation Documentation (Signed)
Called to give report. Nurse unavailable. Will call back 

## 2018-12-22 NOTE — Procedures (Signed)
Pre Procedure Dx: History of prostate cancer, now with multiple liver lesions worrisome for [facility].  Post Procedural Dx: Same  Technically successful US guided biopsy of infiltrative mass within the anterior segment of the right lobe of the liver.   EBL: None No immediate complications.   Ronny Bacon, MD Pager #: [phone]

## 2018-12-22 NOTE — Consult Note (Signed)
Chief Complaint: Patient was seen in consultation today for image guided liver lesion biopsy  Referring Physician(s): [id number]  Supervising Physician: Sandi Mariscal  Patient Status: [facility] - Out-pt  History of Present Illness: Antonio Murphy is a 58 y.o. 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  He now presents with weight loss, some dyspnea with exertion, back and right upper/ lateral abdominal discomfort and imaging findings of osseous metastatic disease, extensive hepatic metastatic disease, small amount of free pelvic fluid and small focal fluid collection at splenic bed adjacent to the pancreas measuring up to 3 cm.  He is scheduled today for image guided liver lesion biopsy for further evaluation.  Latest PSA 0.01.  Past Medical History:  Diagnosis Date   History of benign colon tumor 2014   S/P  PARTIAL COLECOTMY W/  SPLENECTOMY   History of DVT of lower extremity 08/2015   left leg -- treated w/ 3 months with eliquis   History of small bowel obstruction 2014   POST-OP PARTIAL COLECOTMY -- S/P  LYSIS ADHESIONS   History of urinary retention 02/2011   SIRS SECONDARY TO UTI/  ACUTE PROSTATITIS   Hypertension    Prostate cancer ([place]) UROLOGIST-  DR Alinda Money   dx 08-02-2017-- Stage T1c, Gleason 3+3,  PSA 6.94--  treatment plan prostatectomy   Wears glasses    Wears partial dentures    upper    Past Surgical History:  Procedure Laterality Date   ABDOMINAL SURGERY  2014   LYSIS ADHESIONS FOR SMALL BOWEL OBSTRUCTION   ESOPHAGOGASTRODUODENOSCOPY  07/2013   GASTROPANCREATIC SHUNT TO DRAIN PANCREATIC PSEUDOCYST   KNEE ARTHROSCOPY Left 1995   PARTIAL COLECTOMY  2014   in [place] (BENIGN  PER PT)   PROSTATE BIOPSY  08-02-2017   dr borden office   ROBOT ASSISTED LAPAROSCOPIC RADICAL PROSTATECTOMY N/A 09/27/2017   Procedure: XI ROBOTIC ASSISTED LAPAROSCOPIC RADICAL PROSTATECTOMY LEVEL 3;  Surgeon: Raynelle Bring, MD;  Location: [facility];  Service: Urology;  Laterality: N/A;  ONLY NEEDS 210 MIN    Allergies: Patient has no known allergies.  Medications: Prior to Admission medications   Medication Sig Start Date End Date Taking? Authorizing Provider  amLODipine-benazepril (LOTREL) 5-20 MG capsule Take 1 capsule by mouth every morning. 08/09/18  Yes Libby Maw, MD  aspirin EC 81 MG tablet Take 81 mg by mouth 2 (two) times daily.   Yes [provider]  folic acid (FOLVITE) 1 MG tablet Take 1 tablet (1 mg total) by mouth daily. 09/29/18  Yes [place], Holli Humbles, NP  meloxicam (MOBIC) 15 MG tablet Take 1 tablet (15 mg total) by mouth daily. For 10 days and then as needed. 12/02/18  Yes Libby Maw, MD  PARoxetine (PAXIL) 10 MG tablet Take one daily for one week and then increase to 2 daily. Patient taking differently: Take 20 mg by mouth daily.  12/02/18  Yes Libby Maw, MD  traZODone (DESYREL) 50 MG tablet Take 0.5-1 tablets (25-50 mg total) by mouth at bedtime as needed for sleep. 12/15/18  Yes Libby Maw, MD     Family History  Problem Relation Age of Onset   Lung cancer Mother    Aneurysm Father     Social  History   Socioeconomic History   Marital status: Married    Spouse name: Not on file   Number of children: Not on file   Years of education: Not on file   Highest education level: Not on file  Occupational History   Not on file  Social Needs   Financial resource strain: Not on file   Food insecurity    Worry: Not on file    Inability: Not on file   Transportation needs    Medical: Not on file    Non-medical: Not on file  Tobacco Use   Smoking status: Never Smoker   Smokeless tobacco: Never Used   Substance and Sexual Activity   Alcohol use: Yes    Comment: Ocassionally.   Drug use: No   Sexual activity: Yes  Lifestyle   Physical activity    Days per week: Not on file    Minutes per session: Not on file   Stress: Not on file  Relationships   Social connections    Talks on phone: Not on file    Gets together: Not on file    Attends religious service: Not on file    Active member of club or organization: Not on file    Attends meetings of clubs or organizations: Not on file    Relationship status: Not on file  Other Topics Concern   Not on file  Social History Narrative   Not on file      Review of Systems see above; denies fever, chest pain, cough, nausea, vomiting or bleeding  Vital Signs: BP 123/83    Pulse 87    Temp 97.8 F (36.6 C) (Skin)    Resp 16    Ht 6\' 1"  (1.854 m)    Wt 175 lb (79.4 kg)    SpO2 100%    BMI 23.09 kg/m   Physical Exam awake, alert.  Chest clear to auscultation bilaterally.  Heart with regular rate and rhythm.  Abdomen soft, positive bowel sounds, mildly tender right upper/ lateral abdominal region to palpation; no lower extremity edema.  Imaging: Dg Ribs Unilateral Left  Result Date: 12/02/2018 CLINICAL DATA:  Rib pain EXAM: LEFT RIBS - 2 VIEW COMPARISON:  None. FINDINGS: Possible 6 mm oval nodule in the right lung apex. Normal heart size. No pneumothorax. Left rib series demonstrates no acute displaced fracture. Postsurgical changes in the left upper quadrant. IMPRESSION: 1. No acute osseous abnormality 2. Possible 6 mm nodule in the right lung apex. Consider correlation with CT These results will be called to the ordering clinician or representative by the Radiologist Assistant, and communication documented in the PACS or zVision Dashboard. Electronically Signed   By: Donavan Foil M.D.   On: 12/02/2018 21:15   Dg Ribs Unilateral Right  Result Date: 12/02/2018 CLINICAL DATA:  Rib tenderness EXAM: RIGHT RIBS - 2 VIEW COMPARISON:   None. FINDINGS: 6 mm ovoid nodule in the right upper lobe. No acute displaced right rib fracture. No focal osseous abnormality. IMPRESSION: No acute osseous abnormality Electronically Signed   By: Donavan Foil M.D.   On: 12/02/2018 21:16   Dg Lumbar Spine Complete  Result Date: 12/02/2018 CLINICAL DATA:  Lumbar sprain EXAM: LUMBAR SPINE - COMPLETE 4+ VIEW COMPARISON:  None. FINDINGS: Lumbar alignment is normal. Corticated ossific opacity superior, anterior corner of L3 felt consistent with limbus vertebra. Mild wedging of L1. Mild superior endplate deformity 624THL. mild degenerative changes at L1-L2 and L2-L3. IMPRESSION: 1. Mild wedging of L1  and mild superior endplate deformity of 624THL of uncertain chronicity 2. Mild degenerative changes Electronically Signed   By: Donavan Foil M.D.   On: 12/02/2018 21:21   Ct Chest Wo Contrast  Result Date: 12/12/2018 CLINICAL DATA:  Rib films demonstrating possible right upper lobe pulmonary nodule. EXAM: CT CHEST WITHOUT CONTRAST TECHNIQUE: Multidetector CT imaging of the chest was performed following the standard protocol without IV contrast. COMPARISON:  12/02/2018 rib films. FINDINGS: Cardiovascular: Aortic atherosclerosis. Tortuous thoracic aorta. Normal heart size, without pericardial effusion. Lad coronary artery calcification. Mediastinum/Nodes: No supraclavicular adenopathy. No mediastinal or definite hilar adenopathy, given limitations of unenhanced CT. Lungs/Pleura: No pleural fluid. No CT correlate for the question plain film abnormality within the lungs. Bibasilar scarring. A 3 mm right upper lobe pulmonary nodule on 31/100. Mild nodularity along the right minor fissure, including at 4 mm on 62/3. Somewhat ill-defined 3 mm right lower lobe pulmonary nodule on 90/3 Upper Abdomen: Infiltrative tumor throughout the liver, including within the majority of the anterior segment right and medial segment left liver lobes. Example at on the order of 14.1 x 11.6 cm on  150/2. Mild caudate and lateral segment left liver lobe prominence. Splenectomy. Normal imaged portions of the stomach, pancreas, adrenal glands, kidneys. Musculoskeletal: Relatively diffuse sclerotic lesions, consistent with metastatic disease. Example within the anterior aspect of the T6 vertebral body at 1.3 cm on sagittal image 84. IMPRESSION: 1. No pulmonary parenchymal correlate within the right upper lobe. Scattered right-sided pulmonary nodules are nonspecific. 2. Diffuse sclerotic lesions, which given history of prostate cancer, likely represent metastatic disease. 3. Incompletely and suboptimally imaged dominant liver mass or masses. Favor metastatic disease. Differential considerations include hepatocellular carcinoma in the setting of cirrhosis. Consider dedicated abdominopelvic contrast enhanced CT for complete staging. These results will be called to the ordering clinician or representative by the Radiologist Assistant, and communication documented in the PACS or zVision Dashboard. For the plain film abnormality Electronically Signed   By: Abigail Miyamoto M.D.   On: 12/12/2018 08:39   Ct Abdomen Pelvis W Contrast  Result Date: 12/12/2018 CLINICAL DATA:  Unintended weight loss, prostate cancer, prior splenectomy and colon resection, abnormal CT chest today with question liver mass EXAM: CT ABDOMEN AND PELVIS WITH CONTRAST TECHNIQUE: Multidetector CT imaging of the abdomen and pelvis was performed using the standard protocol following bolus administration of intravenous contrast. CONTRAST:  134mL ISOVUE-300 IOPAMIDOL (ISOVUE-300) INJECTION 61% COMPARISON:  CT chest 12/12/2018 FINDINGS: Lower chest: Minimal bibasilar atelectasis Hepatobiliary: Numerous hepatic masses identified consistent with hepatic metastatic disease. Many of the observed lesions are small but a large confluent mass is identified involving the medial segment LEFT lobe as well as RIGHT lobe, 15.7 x 10.3 x 13.9 cm. Gallbladder  unremarkable. Minimal infiltration of perihepatic tissue planes. Pancreas: Normal appearance Spleen: Surgically absent Adrenals/Urinary Tract: Adrenal glands normal appearance. Kidneys, ureters and bladder normal appearance Stomach/Bowel: Appendix not definitely visualized. Stomach and bowel loops unremarkable. Vascular/Lymphatic: Mild atherosclerotic calcifications in the iliac arteries. Tortuosity of iliac arteries and aorta. Aorta normal caliber. No definite adenopathy Reproductive: Prostate gland surgically absent. Other: Small amount of free fluid. No free air. No hernia. Small focal fluid collection identified adjacent to the stomach likely related to prior splenectomy, 3.0 x 1.3 cm image 18. Musculoskeletal: Scattered sclerotic osseous metastases involving the thoracolumbar spine, pelvis, and proximal LEFT femur. IMPRESSION: Osseous metastatic disease, favor due to prostate cancer history. Extensive hepatic metastatic disease including a large mass 15.7 cm greatest size. Small amount of nonspecific  free pelvic fluid. Small focal fluid collection at splenic bed adjacent to pancreas likely postsurgical, 3.0 x 1.3 cm. Electronically Signed   By: Lavonia Dana M.D.   On: 12/12/2018 14:12    Labs:  CBC: Recent Labs    02/16/18 0846 09/21/18 1103 11/02/18 1126 12/12/18 1156  WBC 5.7 11.6* 12.6* 17.9*  HGB 15.2 14.9 13.4 9.9*  HCT 45.2 44.3 41.1 30.8*  PLT 315.0 298 370 371.0    COAGS: No results for input(s): INR, APTT in the last 8760 hours.  BMP: Recent Labs    02/16/18 0846 09/21/18 1103 11/02/18 1126 12/12/18 1156  NA 141 137 137 131*  K 4.7 3.9 4.7 4.8  CL 105 101 100 96  CO2 29 26 28 26   GLUCOSE 117* 111* 100* 99  BUN 16 13 16 22   CALCIUM 10.3 9.7 9.5 9.3  CREATININE 1.15 1.01 0.99 0.78  GFRNONAA  --  >60 >60  --   GFRAA  --  >60 >60  --     LIVER FUNCTION TESTS: Recent Labs    02/16/18 0846 09/21/18 1103 11/02/18 1126 12/12/18 1156  BILITOT 1.2 1.0 0.8 0.7  AST  16 16 26  35  ALT 16 19 21 24   ALKPHOS 55 116 257* 479*  PROT 7.4 7.8 7.4 7.0  ALBUMIN 4.5 4.5 4.3 3.7    TUMOR MARKERS: No results for input(s): AFPTM, CEA, CA199, CHROMGRNA in the last 8760 hours.  Assessment and Plan: 58 y.o. 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  He now presents with weight loss, some dyspnea with exertion, back and right upper/ lateral abdominal discomfort and imaging findings of osseous metastatic disease, extensive hepatic metastatic disease, small amount of free pelvic fluid and small focal fluid collection at splenic bed adjacent to the pancreas measuring up to 3 cm.  He is scheduled today for image guided liver lesion biopsy for further evaluation.  Latest PSA 0.01.Risks and benefits of procedure was discussed with the patient including, but not limited to bleeding, infection, damage to adjacent structures or low yield requiring additional tests.  All of the questions were answered and there is agreement to proceed.  Consent signed and in chart.  LABS PENDING  Thank you for this interesting consult.  I greatly enjoyed meeting Antonio Murphy and look forward to participating in their care.  A copy of this report was sent to the requesting provider on this date.  Electronically Signed: D. Rowe Robert, PA-C 12/22/2018, 11:56 AM   I spent a total of  25 minutes   in face to face in clinical consultation, greater than 50% of which was counseling/coordinating care for image guided liver lesion biopsy

## 2018-12-27 ENCOUNTER — Other Ambulatory Visit: Payer: Self-pay | Admitting: Oncology

## 2018-12-27 ENCOUNTER — Ambulatory Visit (HOSPITAL_COMMUNITY)
Admission: RE | Admit: 2018-12-27 | Discharge: 2018-12-27 | Disposition: A | Discharge status: Home / Self Care | Payer: Commercial Managed Care - PPO | Source: Ambulatory Visit | Attending: Oncology | Admitting: Oncology

## 2018-12-27 ENCOUNTER — Other Ambulatory Visit: Payer: Self-pay

## 2018-12-27 DIAGNOSIS — M549 Dorsalgia, unspecified: Secondary | ICD-10-CM

## 2018-12-27 DIAGNOSIS — R160 Hepatomegaly, not elsewhere classified: Secondary | ICD-10-CM | POA: Insufficient documentation

## 2018-12-27 MED ORDER — HYDROCODONE-ACETAMINOPHEN 5-325 MG PO TABS: 1.0000 | ORAL_TABLET | Freq: Four times a day (QID) | ORAL | 0 refills | Status: AC | PRN

## 2018-12-27 MED ORDER — GADOBUTROL 1 MMOL/ML IV SOLN
8.0000 mL | Freq: Once | INTRAVENOUS | Status: AC | PRN
  Administered 2018-12-27: 21:00:00 8 mL via INTRAVENOUS

## 2018-12-27 MED ORDER — GABAPENTIN 300 MG PO CAPS: 300.0000 mg | ORAL_CAPSULE | Freq: Every day | ORAL | 0 refills | Status: DC

## 2018-12-27 NOTE — Progress Notes (Signed)
The results of the pathology was discussed with the patient.  Further studies including PDL 1 testing as well as next generation sequencing is currently pending.  We have discussed strategies to treat his cancer including systemic chemotherapy, oral targeted therapy, immunotherapy in combination of the above depending on his molecular testing.  At this time he is leaning towards getting treated in his home in [place].  I urged him to make plans immediately for travel as the window of opportunity to treat his cancer is closing quickly given his overall decline.  He is complaining of increased neuropathic pain in his elbow as well as back and legs.  He does not have any neurological deficits.  MRI is scheduled for today and we will prescribe hydrocodone as well as Neurontin for him.  All his questions were answered today to his satisfaction.

## 2018-12-28 ENCOUNTER — Telehealth: Payer: Self-pay

## 2018-12-28 NOTE — Telephone Encounter (Signed)
Received message from patient requesting referral and documents be sent to [facility] in [facility] since he is moving there. Contacted the office at [phone] and was informed that once the forms are received the new patient referral scheduler will contact the patient to set up the initial appt. All requested documents were faxed to [phone]. Contacted patient and made aware that referral and forms were faxed. Patient stated that he has already received a call and initial appointment has been scheduled. No other questions or concerns.

## 2018-12-31 ENCOUNTER — Other Ambulatory Visit: Payer: Self-pay | Admitting: Family Medicine

## 2018-12-31 DIAGNOSIS — F418 Other specified anxiety disorders: Secondary | ICD-10-CM

## 2019-01-04 ENCOUNTER — Other Ambulatory Visit: Payer: Self-pay | Admitting: Family Medicine

## 2019-01-04 DIAGNOSIS — F418 Other specified anxiety disorders: Secondary | ICD-10-CM

## 2019-01-04 MED ORDER — PAROXETINE HCL 10 MG PO TABS: 20.0000 mg | ORAL_TABLET | Freq: Every day | ORAL | 0 refills | Status: AC

## 2019-01-04 NOTE — Telephone Encounter (Signed)
Rx sent in

## 2019-01-04 NOTE — Telephone Encounter (Signed)
RX REFILL PARoxetine (PAXIL) 10 MG tablet   [facility] PHARMACY [facility] [address] [place] [address] [phone]

## 2019-01-06 ENCOUNTER — Ambulatory Visit: Payer: Commercial Managed Care - PPO | Admitting: Family Medicine

## 2019-01-06 ENCOUNTER — Telehealth: Payer: Self-pay

## 2019-01-06 NOTE — Telephone Encounter (Signed)
Patient called office requesting for pathology results from the liver biopsy to be sent to Hospital in [place] where he is currently an Inpatient. Patient has been referred to [facility] in [facility] for Oncology services.  Patient stated he had an appointment with his new oncologist at [facility] on 01/05/19, but stated they are needing the pathology results. Per Dr. Alen Blew, pathology results are still pending and most often takes 2 weeks to result. Informed patient of pending report and that the result will be shared with [facility] once the are available. Informed patient to have [facility] contact us if they need additional information. Pt verbalized understanding.

## 2019-01-06 NOTE — Telephone Encounter (Signed)
Patient's daughter called stating patient is in the ED in [place] for blood clots possibly related to cancer diagnosis. Family is requesting for liver biopsy result to be sent to hospital in [place].  Daughter is not on the patient's HIPAA release of information form, but patient's wife, Antonio Murphy, is.  Spoke to patient's wife, Antonio Murphy, and informed her to have the hospital call [phone] if the physician would like to speak to Dr. Alen Blew or if they would like to request records.  Per note by Leanne Chang, RN on 12/28/18, patient's documents have been sent to [facility] in [facility]. Informed wife that the hospital can also contact the [facility] to get the information needed as well. Patient's wife verbalized understanding.

## 2019-01-11 ENCOUNTER — Encounter (HOSPITAL_COMMUNITY): Payer: Self-pay | Admitting: Oncology

## 2019-01-11 ENCOUNTER — Telehealth: Payer: Self-pay

## 2019-01-11 NOTE — Telephone Encounter (Signed)
Faxed pathology report received on 01/11/19 to [facility] at fax number [phone]. Office phone number is [phone]. Faxed per Dr. Hazeline Junker request. Per Dr. Alen Blew, still awaiting additional reports, but will fax over once received.

## 2019-01-16 ENCOUNTER — Ambulatory Visit: Payer: Commercial Managed Care - PPO | Admitting: Hematology & Oncology

## 2019-01-16 ENCOUNTER — Other Ambulatory Visit: Payer: Commercial Managed Care - PPO

## 2019-01-17 ENCOUNTER — Telehealth: Payer: Self-pay

## 2019-01-17 NOTE — Telephone Encounter (Signed)
Patient's daughter called asking if [facility] results have been received. Call Pathology and per the staff member, PDL one results were received on 9/22 but other results are not back yet. Stated results should be back any day now. Informed patient's daughter and she verbalized understanding. Results will be faxed to Dr. Chryl Heck in [place] at [facility] once received.

## 2019-01-18 ENCOUNTER — Telehealth: Payer: Self-pay

## 2019-01-18 NOTE — Telephone Encounter (Signed)
Called [facility] to check on [facility] results. Per staff member, test was sent off on 9/10 and results should be in sometime this week.

## 2019-01-20 ENCOUNTER — Telehealth: Payer: Self-pay

## 2019-01-21 ENCOUNTER — Other Ambulatory Visit: Payer: Self-pay | Admitting: Oncology

## 2019-08-04 ENCOUNTER — Other Ambulatory Visit: Payer: Self-pay | Admitting: Family Medicine

## 2019-08-04 DIAGNOSIS — I10 Essential (primary) hypertension: Secondary | ICD-10-CM

## 2019-09-22 IMAGING — CT CT ABDOMEN AND PELVIS WITH CONTRAST
1 of 3 series · 13 of 32 positions shown, 19 images · IV contrast (APPLIED)
Comparison: CT chest 12/12/2018

CLINICAL DATA: Unintended weight loss, prostate cancer, prior
splenectomy and colon resection, abnormal CT chest today with
question liver mass

EXAM:
CT ABDOMEN AND PELVIS WITH CONTRAST
TECHNIQUE: Multidetector CT imaging of the abdomen and pelvis was performed
using the standard protocol following bolus administration of
intravenous contrast.
CONTRAST:  100mL 7NBCX1-4II IOPAMIDOL (7NBCX1-4II) INJECTION 61%

[Series 2: abd/pelvis w/cm · axial · 0.76mm/px · z∈[-484,-59]mm · 13 of 99 slices shown, 19 images]
[im 7/99  soft-tissue]
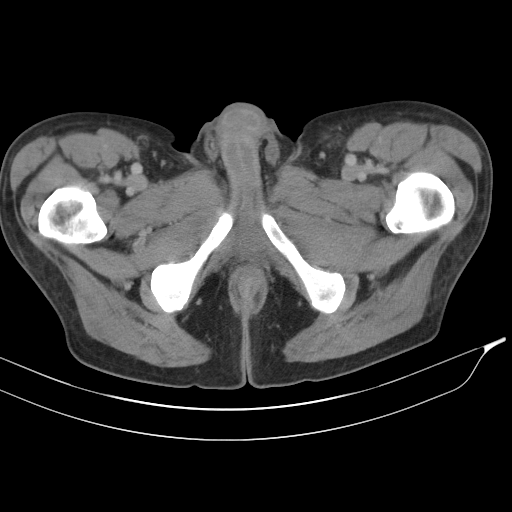
[im 7/99  bone]
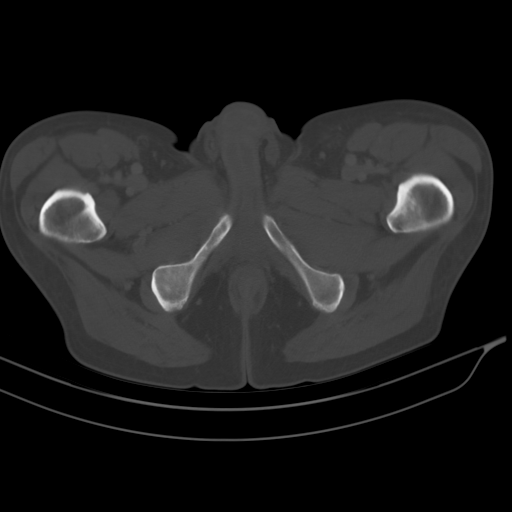
[im 14/99  soft-tissue]
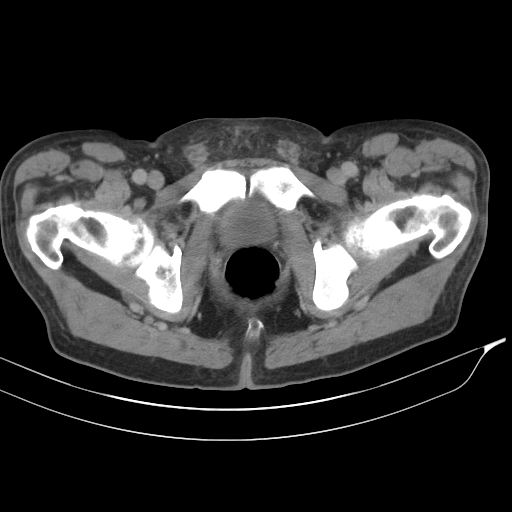
[im 20/99  soft-tissue]
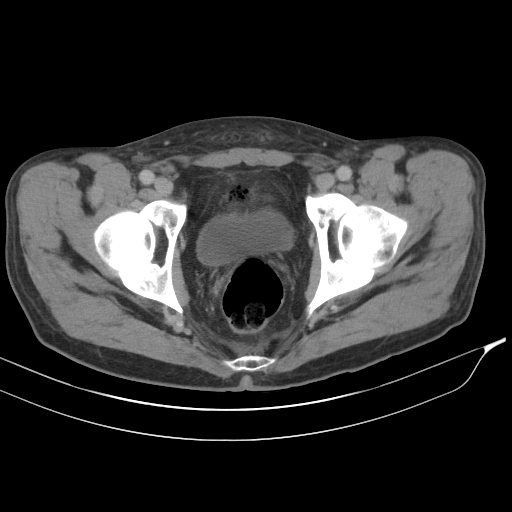
[im 27/99  soft-tissue]
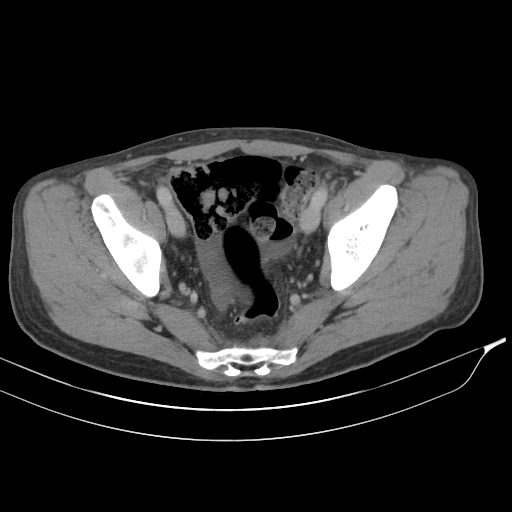
[im 33/99  soft-tissue]
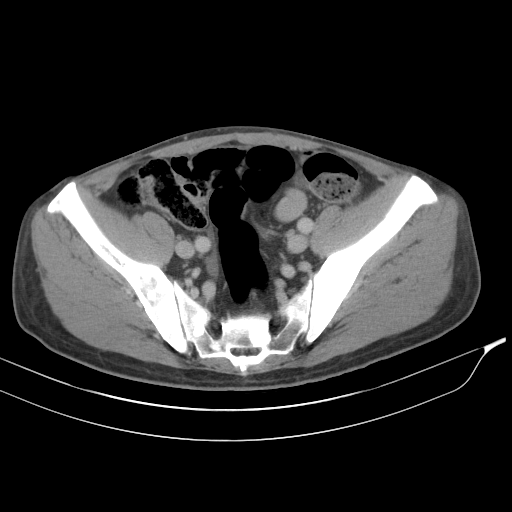
[im 40/99  soft-tissue]
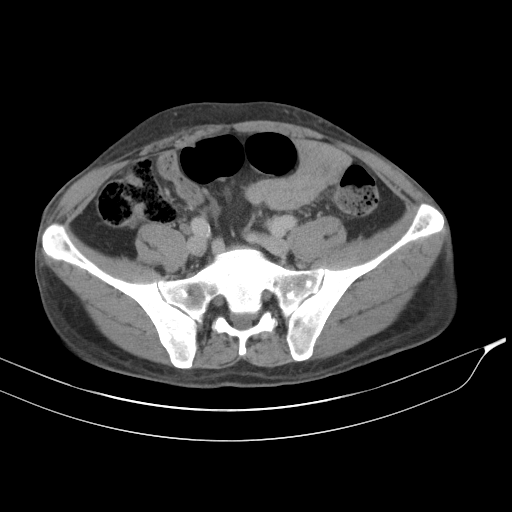
[im 53/99  soft-tissue]
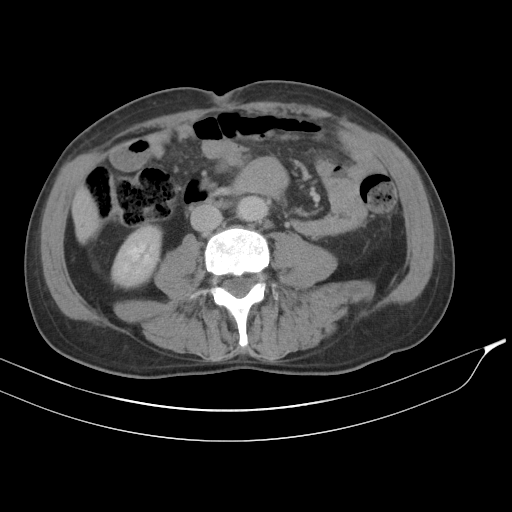
[im 59/99  soft-tissue]
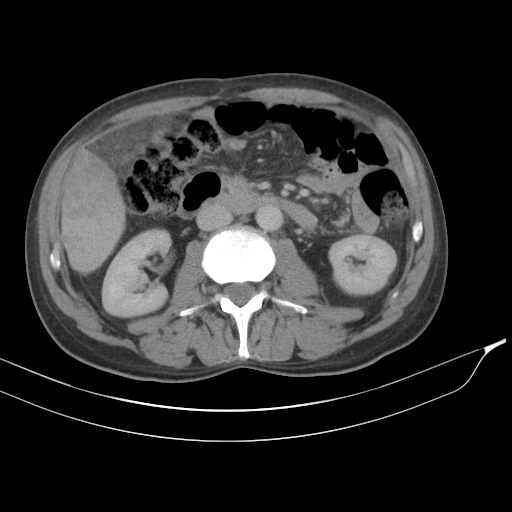
[im 66/99  soft-tissue]
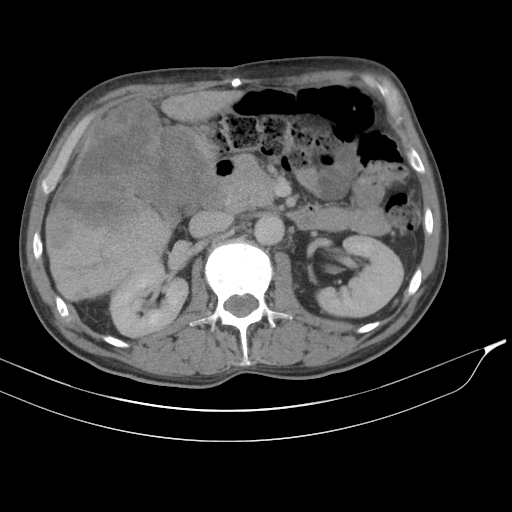
[im 66/99  bone]
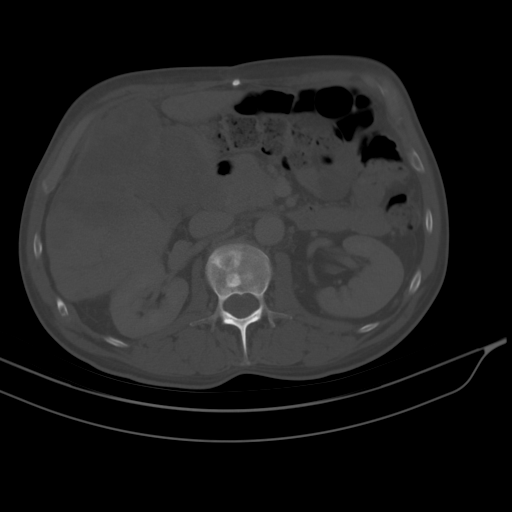
[im 72/99  soft-tissue]
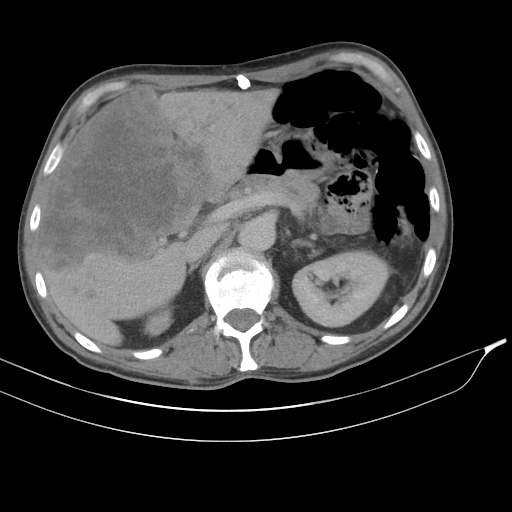
[im 72/99  lung]
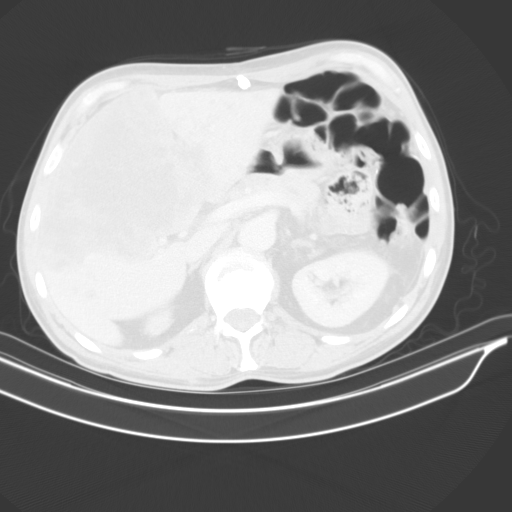
[im 79/99  soft-tissue]
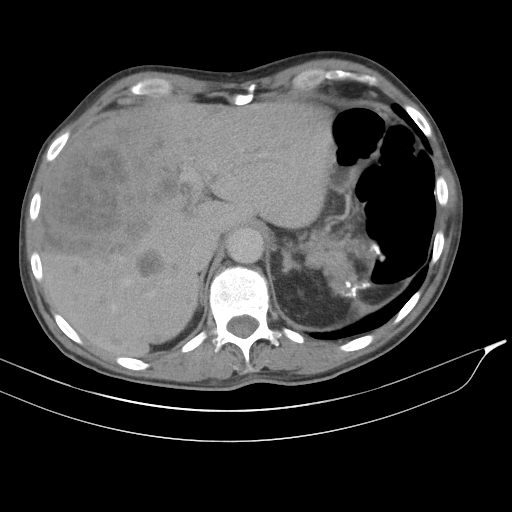
[im 79/99  lung]
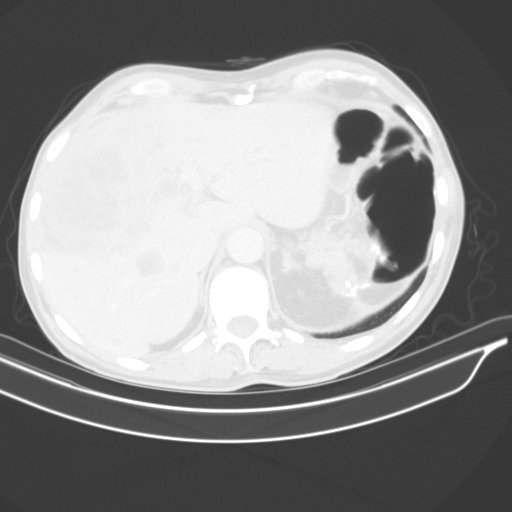
[im 85/99  soft-tissue]
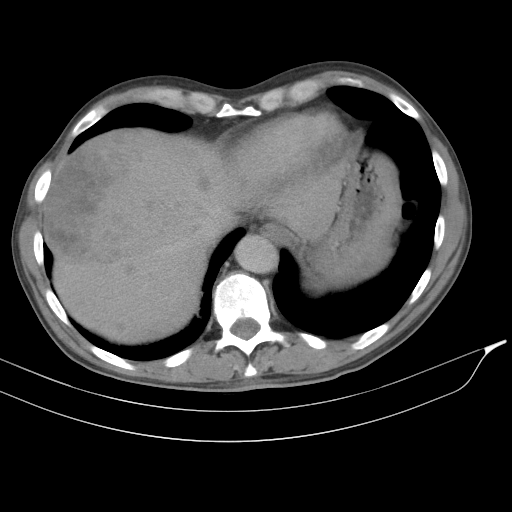
[im 85/99  lung]
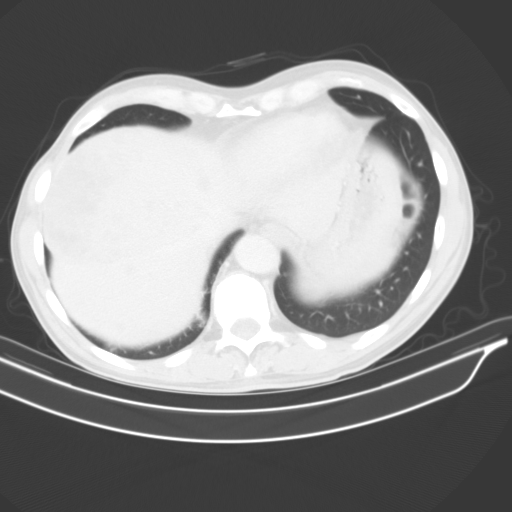
[im 92/99  soft-tissue]
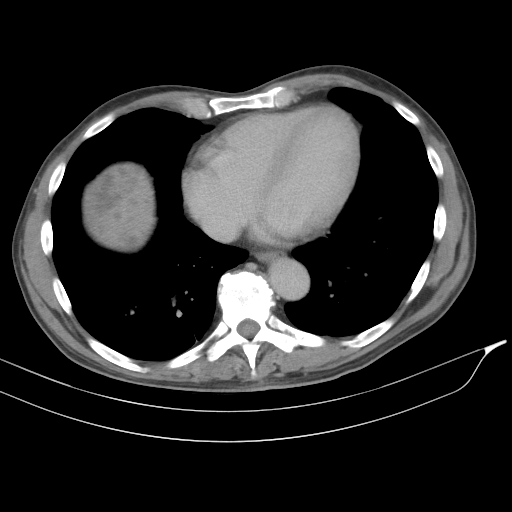
[im 92/99  lung]
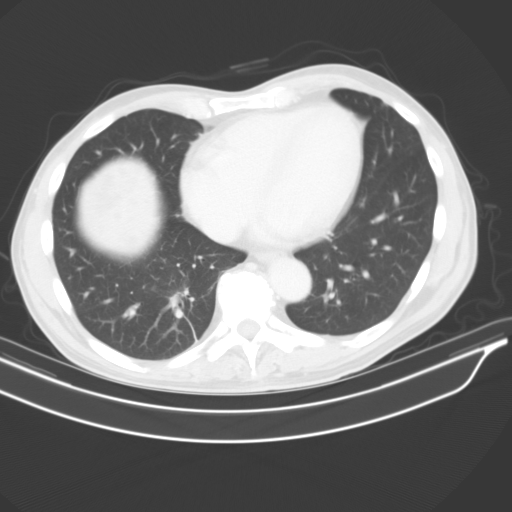

[13 of 32 positions shown; findings below may reference images not displayed]

FINDINGS: Lower chest: Minimal bibasilar atelectasis

Hepatobiliary: Numerous hepatic masses identified consistent with
hepatic metastatic disease. Many of the observed lesions are small
but a large confluent mass is identified involving the medial
segment LEFT lobe as well as RIGHT lobe, 15.7 x 10.3 x 13.9 cm.
Gallbladder unremarkable. Minimal infiltration of perihepatic tissue
planes.

Pancreas: Normal appearance

Spleen: Surgically absent

Adrenals/Urinary Tract: Adrenal glands normal appearance. Kidneys,
ureters and bladder normal appearance

Stomach/Bowel: Appendix not definitely visualized. Stomach and bowel
loops unremarkable.

Vascular/Lymphatic: Mild atherosclerotic calcifications in the iliac
arteries. Tortuosity of iliac arteries and aorta. Aorta normal
caliber. No definite adenopathy

Reproductive: Prostate gland surgically absent.

Other: Small amount of free fluid. No free air. No hernia. Small
focal fluid collection identified adjacent to the stomach likely
related to prior splenectomy, 3.0 x 1.3 cm image 18.

Musculoskeletal: Scattered sclerotic osseous metastases involving
the thoracolumbar spine, pelvis, and proximal LEFT femur.
IMPRESSION: Osseous metastatic disease, favor due to prostate cancer history.

Extensive hepatic metastatic disease including a large mass 15.7 cm
greatest size.

Small amount of nonspecific free pelvic fluid.

Small focal fluid collection at splenic bed adjacent to pancreas
likely postsurgical, 3.0 x 1.3 cm.

## 2019-09-22 IMAGING — CT CT CHEST WITHOUT CONTRAST
1 series · 15 of 34 positions shown, 19 images · non-contrast
Comparison: 12/02/2018 rib films.

CLINICAL DATA: Rib films demonstrating possible right upper lobe
pulmonary nodule.

EXAM:
CT CHEST WITHOUT CONTRAST
TECHNIQUE: Multidetector CT imaging of the chest was performed following the
standard protocol without IV contrast.

[Series 2: chest w/(date) · axial · 0.75mm/px · z∈[-287,-27]mm · 15 of 154 slices shown, 19 images]
[im 12/154  mediastinal]
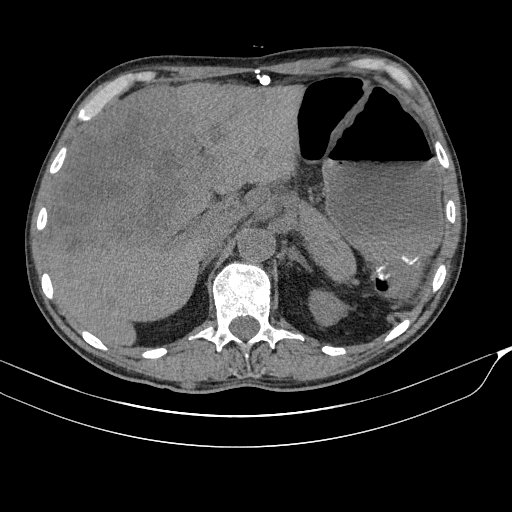
[im 12/154  lung]
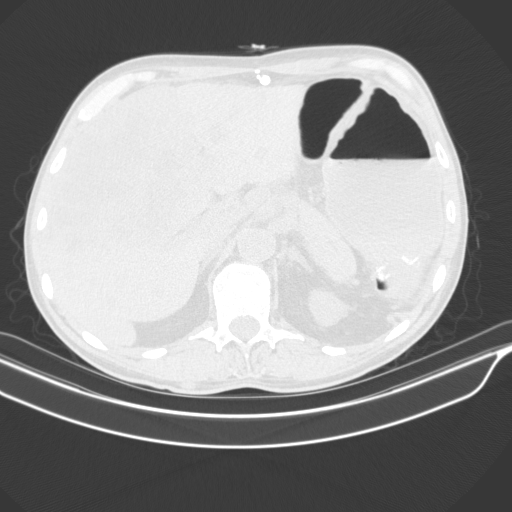
[im 23/154  lung]
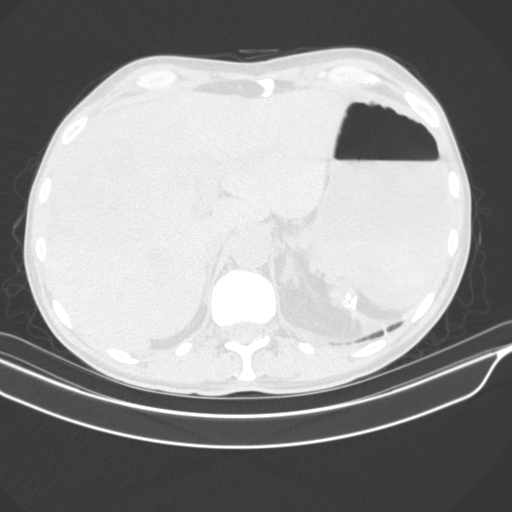
[im 31/154  lung]
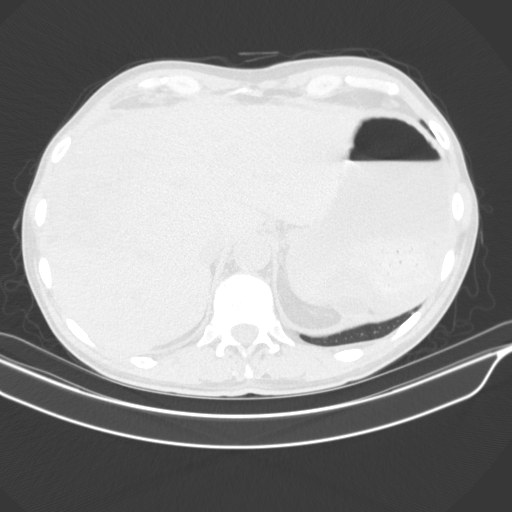
[im 40/154  lung]
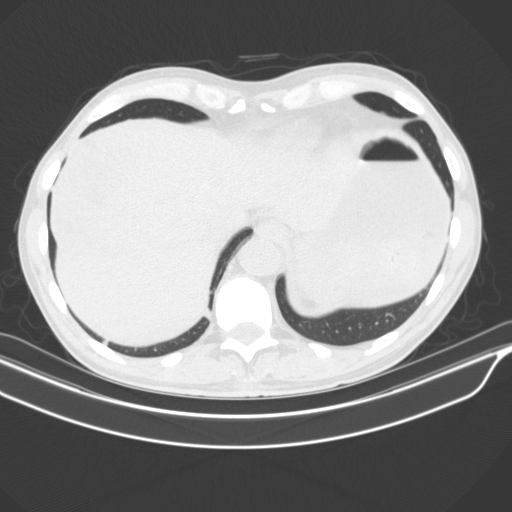
[im 52/154  mediastinal]
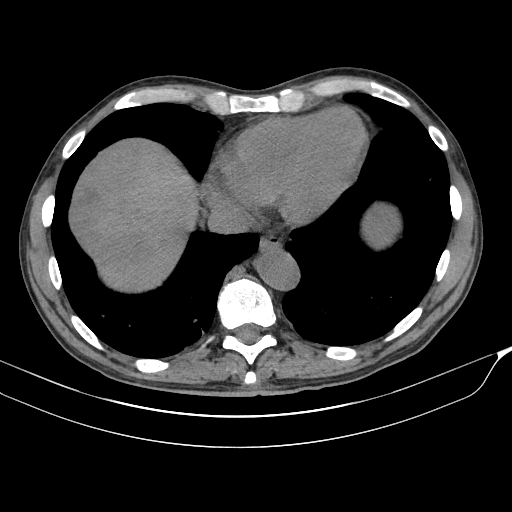
[im 52/154  lung]
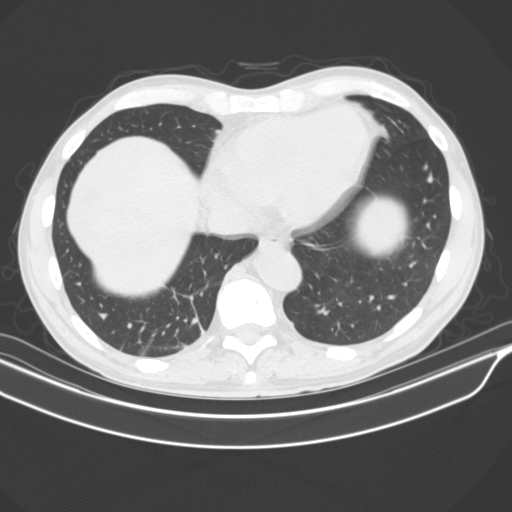
[im 62/154  lung]
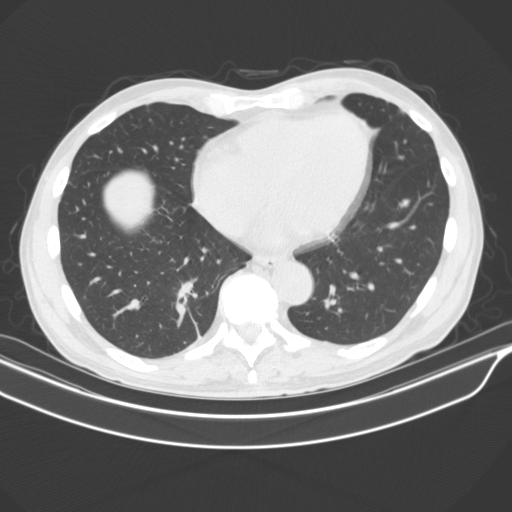
[im 69/154  lung]
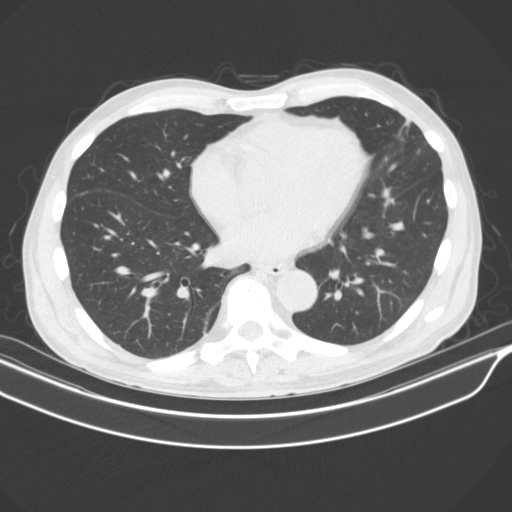
[im 80/154  lung]
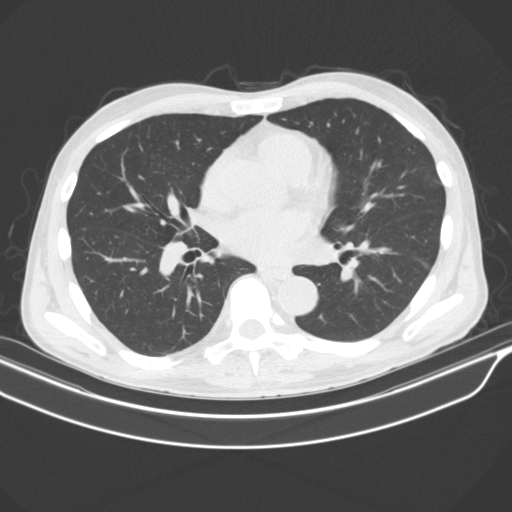
[im 86/154  mediastinal]
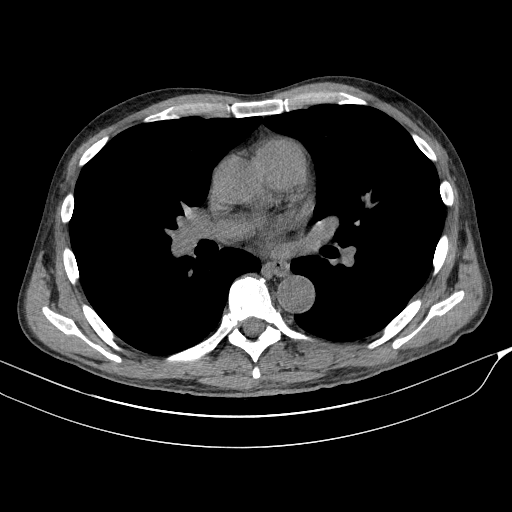
[im 86/154  lung]
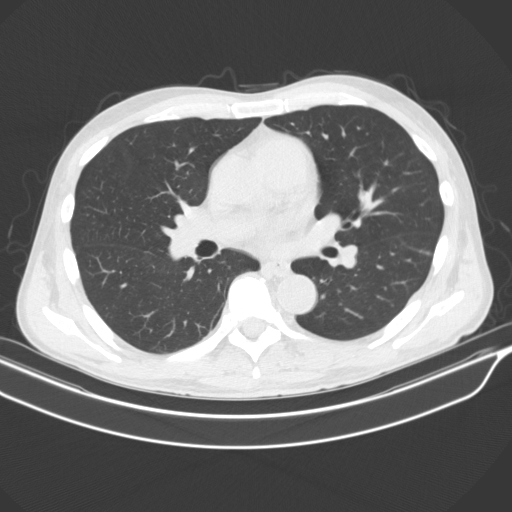
[im 92/154  lung]
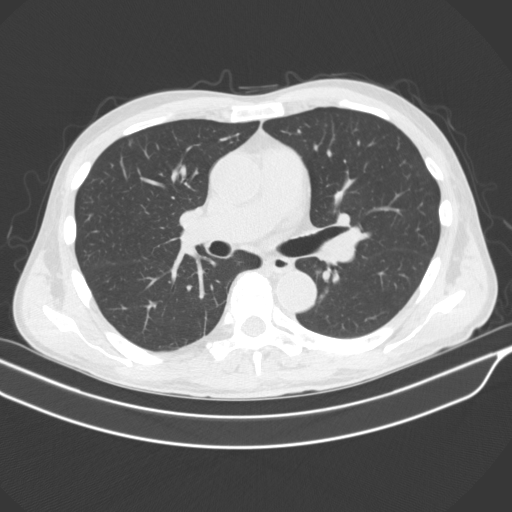
[im 103/154  lung]
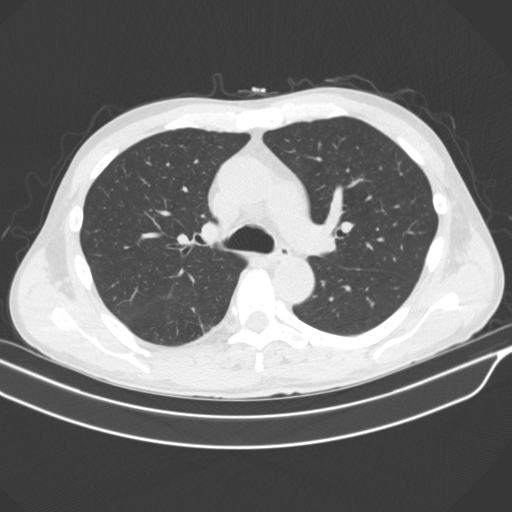
[im 114/154  lung]
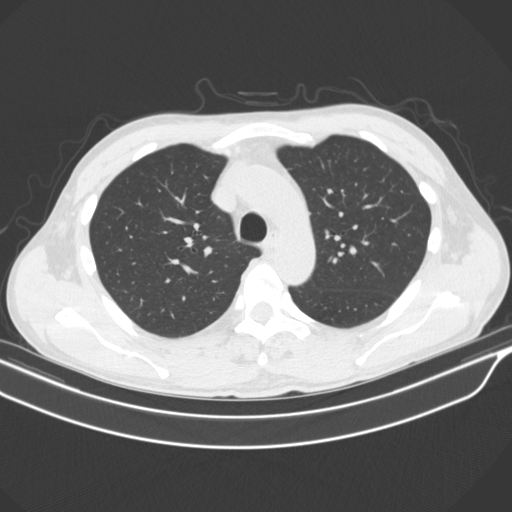
[im 123/154  mediastinal]
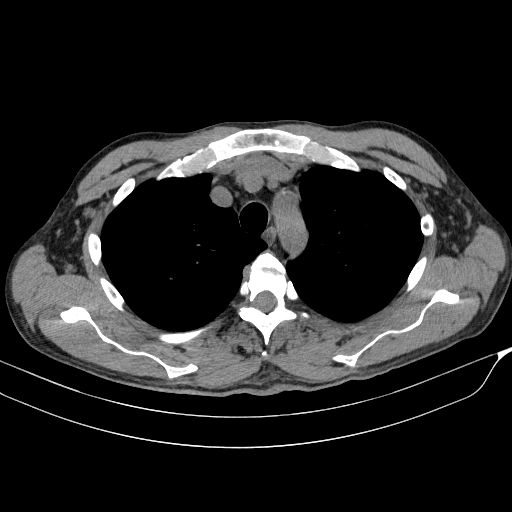
[im 123/154  lung]
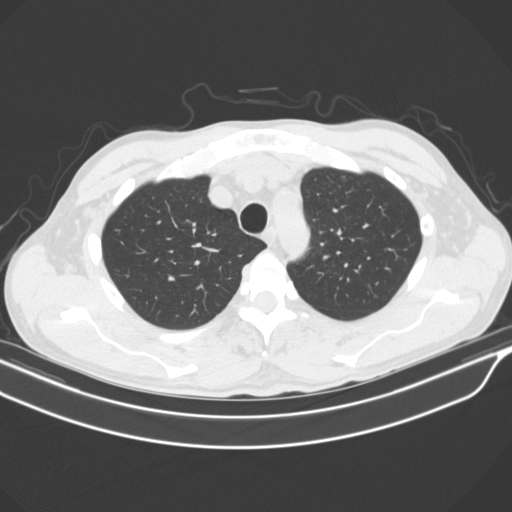
[im 131/154  lung]
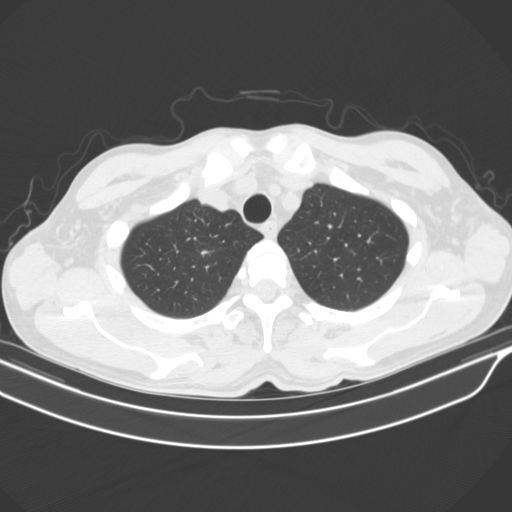
[im 142/154  lung]
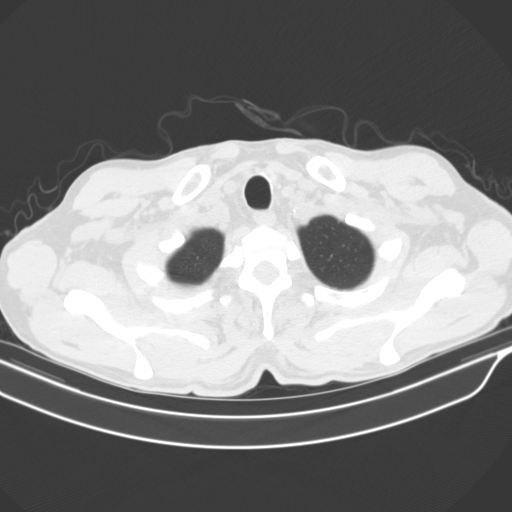

[15 of 34 positions shown; findings below may reference images not displayed]

FINDINGS: Cardiovascular: Aortic atherosclerosis. Tortuous thoracic aorta.
Normal heart size, without pericardial effusion. Lad coronary artery
calcification.

Mediastinum/Nodes: No supraclavicular adenopathy. No mediastinal or
definite hilar adenopathy, given limitations of unenhanced CT.

Lungs/Pleura: No pleural fluid. No CT correlate for the question
plain film abnormality within the lungs.

Bibasilar scarring.

A 3 mm right upper lobe pulmonary nodule on 31/100. Mild nodularity
along the right minor fissure, including at 4 mm on 62/3.

Somewhat ill-defined 3 mm right lower lobe pulmonary nodule on 90/3

Upper Abdomen: Infiltrative tumor throughout the liver, including
within the majority of the anterior segment right and medial segment
left liver lobes. Example at on the order of 14.1 x 11.6 cm on
150/2. Mild caudate and lateral segment left liver lobe prominence.

Splenectomy. Normal imaged portions of the stomach, pancreas,
adrenal glands, kidneys.

Musculoskeletal: Relatively diffuse sclerotic lesions, consistent
with metastatic disease. Example within the anterior aspect of the
T6 vertebral body at 1.3 cm on sagittal image 84.
IMPRESSION: 1. No pulmonary parenchymal correlate within the right upper lobe.
Scattered right-sided pulmonary nodules are nonspecific.
2. Diffuse sclerotic lesions, which given history of prostate
cancer, likely represent metastatic disease.
3. Incompletely and suboptimally imaged dominant liver mass or
masses. Favor metastatic disease. Differential considerations
include hepatocellular carcinoma in the setting of cirrhosis.
Consider dedicated abdominopelvic contrast enhanced CT for complete
staging.

These results will be called to the ordering clinician or
representative by the Radiologist Assistant, and communication
documented in the PACS or zVision Dashboard.

For the plain film abnormality
